# Patient Record
Sex: Female | Born: 1970 | Race: White | Hispanic: No | Marital: Single | Smoking: Never smoker
Health system: Southern US, Community
[De-identification: ages and names within clinical notes are randomized; demographics above are authoritative.]

## PROBLEM LIST (undated history)

## (undated) DIAGNOSIS — F329 Major depressive disorder, single episode, unspecified: Secondary | ICD-10-CM

## (undated) DIAGNOSIS — F419 Anxiety disorder, unspecified: Secondary | ICD-10-CM

## (undated) DIAGNOSIS — F32A Depression, unspecified: Secondary | ICD-10-CM

## (undated) DIAGNOSIS — N200 Calculus of kidney: Secondary | ICD-10-CM

## (undated) DIAGNOSIS — M1990 Unspecified osteoarthritis, unspecified site: Secondary | ICD-10-CM

---
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
## 2006-03-09 ENCOUNTER — Emergency Department: Payer: Self-pay | Admitting: Emergency Medicine

## 2006-03-10 ENCOUNTER — Emergency Department: Payer: Self-pay | Admitting: Emergency Medicine

## 2012-08-04 ENCOUNTER — Emergency Department: Payer: Self-pay | Admitting: Emergency Medicine

## 2012-08-04 LAB — CBC
HGB: 12.6 g/dL (ref 12.0–16.0)
MCH: 27.9 pg (ref 26.0–34.0)
MCHC: 32.1 g/dL (ref 32.0–36.0)
Platelet: 232 10*3/uL (ref 150–440)
RBC: 4.51 10*6/uL (ref 3.80–5.20)
WBC: 13.4 10*3/uL — ABNORMAL HIGH (ref 3.6–11.0)

## 2012-08-04 LAB — COMPREHENSIVE METABOLIC PANEL
Albumin: 3.7 g/dL (ref 3.4–5.0)
Alkaline Phosphatase: 65 U/L (ref 50–136)
BUN: 10 mg/dL (ref 7–18)
Bilirubin,Total: 0.4 mg/dL (ref 0.2–1.0)
Chloride: 107 mmol/L (ref 98–107)
Creatinine: 0.95 mg/dL (ref 0.60–1.30)
SGOT(AST): 25 U/L (ref 15–37)
SGPT (ALT): 21 U/L (ref 12–78)
Total Protein: 7.2 g/dL (ref 6.4–8.2)

## 2012-08-04 LAB — URINALYSIS, COMPLETE
Glucose,UR: NEGATIVE mg/dL (ref 0–75)
Nitrite: NEGATIVE
Ph: 5 (ref 4.5–8.0)
Specific Gravity: 1.031 (ref 1.003–1.030)
Squamous Epithelial: 9

## 2013-02-06 ENCOUNTER — Emergency Department: Payer: Self-pay | Admitting: Emergency Medicine

## 2013-02-06 LAB — CBC
HCT: 37 % (ref 35.0–47.0)
MCH: 29.6 pg (ref 26.0–34.0)
MCV: 88 fL (ref 80–100)
Platelet: 281 10*3/uL (ref 150–440)
RBC: 4.23 10*6/uL (ref 3.80–5.20)
RDW: 13.1 % (ref 11.5–14.5)

## 2013-02-06 LAB — BASIC METABOLIC PANEL
Co2: 27 mmol/L (ref 21–32)
Creatinine: 0.67 mg/dL (ref 0.60–1.30)
EGFR (Non-African Amer.): >60
Potassium: 3.1 mmol/L — ABNORMAL LOW (ref 3.5–5.1)
Sodium: 139 mmol/L (ref 136–145)

## 2015-06-24 ENCOUNTER — Emergency Department
Admission: EM | Admit: 2015-06-24 | Discharge: 2015-06-24 | Disposition: A | Discharge status: Home / Self Care | Payer: No Typology Code available for payment source | Attending: Emergency Medicine | Admitting: Emergency Medicine

## 2015-06-24 ENCOUNTER — Emergency Department: Payer: No Typology Code available for payment source

## 2015-06-24 DIAGNOSIS — Z79899 Other long term (current) drug therapy: Secondary | ICD-10-CM | POA: Insufficient documentation

## 2015-06-24 DIAGNOSIS — R109 Unspecified abdominal pain: Secondary | ICD-10-CM | POA: Diagnosis present

## 2015-06-24 DIAGNOSIS — N390 Urinary tract infection, site not specified: Secondary | ICD-10-CM | POA: Diagnosis not present

## 2015-06-24 DIAGNOSIS — Z87442 Personal history of urinary calculi: Secondary | ICD-10-CM | POA: Diagnosis not present

## 2015-06-24 HISTORY — DX: Calculus of kidney: N20.0

## 2015-06-24 LAB — URINALYSIS COMPLETE WITH MICROSCOPIC (ARMC ONLY)
BILIRUBIN URINE: NEGATIVE
Glucose, UA: NEGATIVE mg/dL
Ketones, ur: NEGATIVE mg/dL
Leukocytes, UA: NEGATIVE
Nitrite: NEGATIVE
PH: 6 (ref 5.0–8.0)
PROTEIN: NEGATIVE mg/dL
Specific Gravity, Urine: 1.006 (ref 1.005–1.030)

## 2015-06-24 LAB — CBC
HEMATOCRIT: 38.5 % (ref 35.0–47.0)
HEMOGLOBIN: 12.7 g/dL (ref 12.0–16.0)
MCH: 28.6 pg (ref 26.0–34.0)
MCHC: 33 g/dL (ref 32.0–36.0)
MCV: 86.8 fL (ref 80.0–100.0)
Platelets: 219 10*3/uL (ref 150–440)
RBC: 4.43 MIL/uL (ref 3.80–5.20)
RDW: 14.2 % (ref 11.5–14.5)
WBC: 6.7 10*3/uL (ref 3.6–11.0)

## 2015-06-24 LAB — BASIC METABOLIC PANEL
ANION GAP: 9 (ref 5–15)
BUN: 10 mg/dL (ref 6–20)
CHLORIDE: 105 mmol/L (ref 101–111)
CO2: 24 mmol/L (ref 22–32)
Calcium: 9.1 mg/dL (ref 8.9–10.3)
Creatinine, Ser: 0.7 mg/dL (ref 0.44–1.00)
GFR calc Af Amer: >60 mL/min (ref >60)
GFR calc non Af Amer: >60 mL/min (ref >60)
Glucose, Bld: 88 mg/dL (ref 65–99)
Potassium: 3.5 mmol/L (ref 3.5–5.1)
Sodium: 138 mmol/L (ref 135–145)

## 2015-06-24 MED ORDER — ONDANSETRON HCL 4 MG PO TABS: ORAL_TABLET | ORAL | Status: DC

## 2015-06-24 MED ORDER — CEPHALEXIN 500 MG PO CAPS: 500.0000 mg | ORAL_CAPSULE | Freq: Two times a day (BID) | ORAL | Status: DC

## 2015-06-24 MED ORDER — ONDANSETRON 8 MG PO TBDP
8.0000 mg | ORAL_TABLET | Freq: Once | ORAL | Status: AC
  Administered 2015-06-24: 8 mg via ORAL
  Filled 2015-06-24: qty 1

## 2015-06-24 MED ORDER — CEPHALEXIN 500 MG PO CAPS
500.0000 mg | ORAL_CAPSULE | Freq: Once | ORAL | Status: AC
  Administered 2015-06-24: 500 mg via ORAL
  Filled 2015-06-24: qty 1

## 2015-06-24 MED ORDER — HYDROCODONE-ACETAMINOPHEN 5-325 MG PO TABS: 1.0000 | ORAL_TABLET | ORAL | Status: DC | PRN

## 2015-06-24 MED ORDER — OXYCODONE-ACETAMINOPHEN 5-325 MG PO TABS
2.0000 | ORAL_TABLET | Freq: Once | ORAL | Status: AC
  Administered 2015-06-24: 2 via ORAL
  Filled 2015-06-24: qty 2

## 2015-06-24 NOTE — ED Notes (Signed)
AAOx3.  Skin warm and dry.  NAD 

## 2015-06-24 NOTE — ED Notes (Signed)
Pt c/o left flank pain since yesterday with nausea and blood in urine

## 2015-06-24 NOTE — ED Notes (Signed)
AAOx3.  Skin warm and dry.  NAD.  D/C home 

## 2015-06-24 NOTE — ED Provider Notes (Signed)
[facility] Emergency Department Provider Note  ____________________________________________  Time seen: Approximately 8:14 PM  I have reviewed the triage vital signs and the nursing notes.   HISTORY  Chief Complaint Flank Pain    HPI Brianna Randall is a 44 y.o. female with a history of kidney stones diagnosed about 4 years ago who presents with acute onset of sharp, stabbing left flank pain that started yesterday.  It is no waxing and waning but is getting worse.  She has also had multiple episodes of emesis associated with the pain.  She was last diagnosed with kidney stones about 4 years ago and they passed on their own.  She denies fever/chills, chest pain, shortness of breath, and other abdominal pain not from the flank.  Nothing makes it better and moving around seems to make it worse.   Past Medical History  Diagnosis Date  . Kidney stones     There are no active problems to display for this patient.   History reviewed. No pertinent past surgical history.  Current Outpatient Rx  Name  Route  Sig  Dispense  Refill  . ibuprofen (ADVIL,MOTRIN) 200 MG tablet   Oral   Take 800 mg by mouth every 6 (six) hours as needed for headache or mild pain.         . TURMERIC PO   Oral   Take 1 capsule by mouth daily.         . cephALEXin (KEFLEX) 500 MG capsule   Oral   Take 1 capsule (500 mg total) by mouth 2 (two) times daily.   14 capsule   0   . HYDROcodone-acetaminophen (NORCO/VICODIN) 5-325 MG per tablet   Oral   Take 1-2 tablets by mouth every 4 (four) hours as needed for moderate pain.   15 tablet   0   . ondansetron (ZOFRAN) 4 MG tablet      Take 1-2 tabs by mouth every 8 hours as needed for nausea/vomiting   30 tablet   0     Allergies Review of patient's allergies indicates no known allergies.  No family history on file.  Social History History  Substance Use Topics  . Smoking status: Never Smoker   . Smokeless tobacco:  Never Used  . Alcohol Use: No    Review of Systems Constitutional: No fever/chills Eyes: No visual changes. ENT: No sore throat. Cardiovascular: Denies chest pain. Respiratory: Denies shortness of breath. Gastrointestinal: No abdominal pain.  Multiple episodes of emesis.  No diarrhea.  No constipation. Genitourinary: Negative for dysuria. Musculoskeletal: Left flank pain. Skin: Negative for rash. Neurological: Negative for headaches, focal weakness or numbness.  10-point ROS otherwise negative.  ____________________________________________   PHYSICAL EXAM:  VITAL SIGNS: ED Triage Vitals  Enc Vitals Group     BP 06/24/15 1703 143/70 mmHg     Pulse Rate 06/24/15 1703 80     Resp 06/24/15 1703 16     Temp 06/24/15 1703 98.8 F (37.1 C)     Temp Source 06/24/15 1703 Oral     SpO2 06/24/15 1703 100 %     Weight 06/24/15 1703 220 lb (99.791 kg)     Height 06/24/15 1703  (1.651 m)     Head Cir --      Peak Flow --      Pain Score 06/24/15 1704 8     Pain Loc --      Pain Edu? --      Excl.  in GC? --     Constitutional: Alert and oriented. Well appearing and in no acute distress. Eyes: Conjunctivae are normal. PERRL. EOMI. Head: Atraumatic. Nose: No congestion/rhinnorhea. Mouth/Throat: Mucous membranes are moist.  Oropharynx non-erythematous. Neck: No stridor.   Cardiovascular: Normal rate, regular rhythm. Grossly normal heart sounds.  Good peripheral circulation. Respiratory: Normal respiratory effort.  No retractions. Lungs CTAB. Gastrointestinal: Soft and nontender. No distention. No abdominal bruits.  Mild left CVA tenderness. Musculoskeletal: No lower extremity tenderness nor edema.  No joint effusions. Neurologic:  Normal speech and language. No gross focal neurologic deficits are appreciated.  Skin:  Skin is warm, dry and intact. No rash noted.   ____________________________________________   LABS (all labs ordered are listed, but only abnormal results  are displayed)  Labs Reviewed  URINALYSIS COMPLETEWITH MICROSCOPIC ([facility] ONLY) - Abnormal; Notable for the following:    Color, Urine STRAW (*)    APPearance CLEAR (*)    Hgb urine dipstick 1+ (*)    Bacteria, UA RARE (*)    Squamous Epithelial / LPF 0-5 (*)    All other components within normal limits  URINE CULTURE  BASIC METABOLIC PANEL  CBC   ____________________________________________  EKG  Not indicated ____________________________________________  RADIOLOGY  US Renal  06/24/2015   CLINICAL DATA:  Acute left flank pain for 1 day  EXAM: RENAL / URINARY TRACT ULTRASOUND COMPLETE  COMPARISON:  None.  FINDINGS: Right Kidney:  Length: 11.9 cm. Echogenicity within normal limits. No mass or hydronephrosis visualized.  Left Kidney:  Length: 13.1 cm. Echogenicity within normal limits. No mass or hydronephrosis visualized.  Bladder:  Appears normal for degree of bladder distention. Bilateral ureteral jets are noted.  IMPRESSION: Normal renal ultrasound   Electronically Signed   By: Sherian Rein M.D.   On: 06/24/2015 21:26    ____________________________________________   PROCEDURES  Procedure(s) performed: None  Critical Care performed: No ____________________________________________   INITIAL IMPRESSION / ASSESSMENT AND PLAN / ED COURSE  Pertinent labs & imaging results that were available during my care of the patient were reviewed by me and considered in my medical decision making (see chart for details).  The patient is in no acute distress with normal vital signs.  She has a normal CBC and metabolic panel and her urinalysis is generally unremarkable.  Given her history of kidney stones and the description of her symptoms, I suspect she Scioneaux have already passed a stone and that is why there is not much in the way of hematuria.  She has no indication of systemic illness or acute, emergent intra-abdominal pathology.  I will obtain an ultrasound of her kidneys to look for  nephrolithiasis as well as evidence of hydronephrosis.  ----------------------------------------- 9:42 PM on 06/24/2015 -----------------------------------------  The patient has been in no acute distress for her nearly 5 and half hours in the emergency department.  Her pain is currently well controlled.  Her ultrasound was unremarkable.  I believe she Garczynski have hard he passed a kidney stone.  We discussed the results, and she stated that her urine is very foul-smelling and she decided that she does have some dysuria.  As a result I will treat with Keflex empirically.  Her urine culture is pending as well.  I gave my usual customary return precautions.  ____________________________________________  FINAL CLINICAL IMPRESSION(S) / ED DIAGNOSES  Final diagnoses:  Acute left flank pain  UTI (urinary tract infection), uncomplicated      NEW MEDICATIONS STARTED DURING THIS VISIT:  New  Prescriptions   CEPHALEXIN (KEFLEX) 500 MG CAPSULE    Take 1 capsule (500 mg total) by mouth 2 (two) times daily.   HYDROCODONE-ACETAMINOPHEN (NORCO/VICODIN) 5-325 MG PER TABLET    Take 1-2 tablets by mouth every 4 (four) hours as needed for moderate pain.   ONDANSETRON (ZOFRAN) 4 MG TABLET    Take 1-2 tabs by mouth every 8 hours as needed for nausea/vomiting     Loleta Roseory Trisha Morandi, MD 06/24/15 2142

## 2015-06-24 NOTE — ED Notes (Signed)
Left flank pain since yesterday  Pain is getting worse. Hx of renal stones in past

## 2015-06-24 NOTE — Discharge Instructions (Signed)
As we discussed, your workup was reassuring today, including a normal ultrasound.  Since you are having symptoms of UTI, we will treat you with antibiotics and a small number of pain pills and nausea medication.  We recommend you follow up with [facility] to set up a primary care doctor, and with Dr. Apolinar Junes if you need Urology follow up.  Return to the Emergency Department if you develop new or worsening symptoms that concern you.   Flank Pain Flank pain refers to pain that is located on the side of the body between the upper abdomen and the back. The pain Mier occur over a short period of time (acute) or Magee be long-term or reoccurring (chronic). It Hark be mild or severe. Flank pain can be caused by many things. CAUSES  Some of the more common causes of flank pain include:  Muscle strains.   Muscle spasms.   A disease of your spine (vertebral disk disease).   A lung infection (pneumonia).   Fluid around your lungs (pulmonary edema).   A kidney infection.   Kidney stones.   A very painful skin rash caused by the chickenpox virus (shingles).   Gallbladder disease.  HOME CARE INSTRUCTIONS  Home care will depend on the cause of your pain. In general,  Rest as directed by your caregiver.  Drink enough fluids to keep your urine clear or pale yellow.  Only take over-the-counter or prescription medicines as directed by your caregiver. Some medicines Leasure help relieve the pain.  Tell your caregiver about any changes in your pain.  Follow up with your caregiver as directed. SEEK IMMEDIATE MEDICAL CARE IF:   Your pain is not controlled with medicine.   You have new or worsening symptoms.  Your pain increases.   You have abdominal pain.   You have shortness of breath.   You have persistent nausea or vomiting.   You have swelling in your abdomen.   You feel faint or pass out.   You have blood in your urine.  You have a fever or persistent symptoms  for more than 2-3 days.  You have a fever and your symptoms suddenly get worse. MAKE SURE YOU:   Understand these instructions.  Will watch your condition.  Will get help right away if you are not doing well or get worse. Document Released: 01/14/2006 Document Revised: 08/17/2012 Document Reviewed: 07/07/2012 [facility] Patient Information 2015 [place], [place]. This information is not intended to replace advice given to you by your health care provider. Make sure you discuss any questions you have with your health care provider.  Urinary Tract Infection A urinary tract infection (UTI) can occur any place along the urinary tract. The tract includes the kidneys, ureters, bladder, and urethra. A type of germ called bacteria often causes a UTI. UTIs are often helped with antibiotic medicine.  HOME CARE   If given, take antibiotics as told by your doctor. Finish them even if you start to feel better.  Drink enough fluids to keep your pee (urine) clear or pale yellow.  Avoid tea, drinks with caffeine, and bubbly (carbonated) drinks.  Pee often. Avoid holding your pee in for a long time.  Pee before and after having sex (intercourse).  Wipe from front to back after you poop (bowel movement) if you are a woman. Use each tissue only once. GET HELP RIGHT AWAY IF:   You have back pain.  You have lower belly (abdominal) pain.  You have chills.  You  feel sick to your stomach (nauseous).  You throw up (vomit).  Your burning or discomfort with peeing does not go away.  You have a fever.  Your symptoms are not better in 3 days. MAKE SURE YOU:   Understand these instructions.  Will watch your condition.  Will get help right away if you are not doing well or get worse. Document Released: 05/11/2008 Document Revised: 08/17/2012 Document Reviewed: 06/23/2012 [facility] Patient Information 2015 [place]. This information is not intended to replace advice given to you by your health  care provider. Make sure you discuss any questions you have with your health care provider.

## 2015-06-26 LAB — URINE CULTURE
Culture: >=100000
SPECIAL REQUESTS: NORMAL

## 2015-09-10 ENCOUNTER — Emergency Department
Admission: EM | Admit: 2015-09-10 | Discharge: 2015-09-10 | Disposition: A | Discharge status: Home / Self Care | Payer: No Typology Code available for payment source | Attending: Student | Admitting: Student

## 2015-09-10 ENCOUNTER — Encounter: Payer: Self-pay | Admitting: Emergency Medicine

## 2015-09-10 ENCOUNTER — Emergency Department: Payer: No Typology Code available for payment source

## 2015-09-10 DIAGNOSIS — S0083XA Contusion of other part of head, initial encounter: Secondary | ICD-10-CM | POA: Insufficient documentation

## 2015-09-10 DIAGNOSIS — Y998 Other external cause status: Secondary | ICD-10-CM | POA: Insufficient documentation

## 2015-09-10 DIAGNOSIS — S022XXA Fracture of nasal bones, initial encounter for closed fracture: Secondary | ICD-10-CM | POA: Insufficient documentation

## 2015-09-10 DIAGNOSIS — S0990XA Unspecified injury of head, initial encounter: Secondary | ICD-10-CM | POA: Insufficient documentation

## 2015-09-10 DIAGNOSIS — Y9389 Activity, other specified: Secondary | ICD-10-CM | POA: Insufficient documentation

## 2015-09-10 DIAGNOSIS — Y9289 Other specified places as the place of occurrence of the external cause: Secondary | ICD-10-CM | POA: Insufficient documentation

## 2015-09-10 MED ORDER — HYDROMORPHONE HCL 1 MG/ML IJ SOLN
1.0000 mg | Freq: Once | INTRAMUSCULAR | Status: AC
  Administered 2015-09-10: 1 mg via INTRAMUSCULAR
  Filled 2015-09-10: qty 1

## 2015-09-10 MED ORDER — OXYCODONE-ACETAMINOPHEN 5-325 MG PO TABS: 1.0000 | ORAL_TABLET | ORAL | Status: DC | PRN

## 2015-09-10 NOTE — Discharge Instructions (Signed)
Nasal Fracture A nasal fracture is a break or crack in the bones or cartilage of the nose. Minor breaks do not require treatment. These breaks usually heal on their own after about one month. Serious breaks Sarris require surgery. CAUSES This injury is usually caused by a blunt injury to the nose. This type of injury often occurs from:  Contact sports.  Car accidents.  Falls.  Getting punched. SYMPTOMS Symptoms of this injury include:  Pain.  Swelling of the nose.  Bleeding from the nose.  Bruising around the nose or eyes. This Mcgaha include having black eyes.  Crooked appearance of the nose. DIAGNOSIS This injury Grabinski be diagnosed with a physical exam. The health care provider will gently feel the nose for signs of broken bones. He or she will look inside the nostrils to make sure that there is not a blood-filled swelling on the dividing wall between the nostrils (septal hematoma). X-rays of the nose Brandhorst not show a nasal fracture even when one is present. In some cases, X-rays or a CT scan Jehle be done 1-5 days after the injury. Sometimes, the health care provider will want to wait until the swelling has gone down. TREATMENT Often, minor fractures that have caused no deformity do not require treatment. More serious fractures in which bones have moved out of position Malloy require surgery, which will take place after the swelling is gone. Surgery will stabilize and align the fracture. In some cases, a health care provider Honea be able to reposition the bones without surgery. This Rittenhouse be done in the health care provider's office after medicine is given to numb the area (local anesthetic). HOME CARE INSTRUCTIONS  If directed, apply ice to the injured area:  Put ice in a plastic bag.  Place a towel between your skin and the bag.  Leave the ice on for 20 minutes, 2-3 times per day.  Take over-the-counter and prescription medicines only as told by your health care provider.  If your nose  starts to bleed, sit in an upright position while you squeeze the soft parts of your nose against the dividing wall between your nostrils (septum) for 10 minutes.  Try to avoid blowing your nose.  Return to your normal activities as told by your health care provider. Ask your health care provider what activities are safe for you.  Avoid contact sports for 3-4 weeks or as told by your health care provider.  Keep all follow-up visits as told by your health care provider. This is important. SEEK MEDICAL CARE IF:  Your pain increases or becomes severe.  You continue to have nosebleeds.  The shape of your nose does not return to normal within 5 days.  You have pus draining out of your nose. SEEK IMMEDIATE MEDICAL CARE IF:  You have bleeding from your nose that does not stop after you pinch your nostrils closed for 20 minutes and keep ice on your nose.  You have clear fluid draining out of your nose.  You notice a grape-like swelling on the septum. This swelling is a collection of blood (hematoma) that must be drained to help prevent infection.  You have difficulty moving your eyes.  You have repeated vomiting.   This information is not intended to replace advice given to you by your health care provider. Make sure you discuss any questions you have with your health care provider.   Document Released: 11/20/2000 Document Revised: 08/14/2015 Document Reviewed: 12/31/2014 Elsevier Interactive Patient Education 2016 Elsevier Inc.  
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
## 2016-05-07 ENCOUNTER — Encounter: Payer: Self-pay | Admitting: Emergency Medicine

## 2016-05-07 ENCOUNTER — Emergency Department: Payer: No Typology Code available for payment source

## 2016-05-07 ENCOUNTER — Emergency Department
Admission: EM | Admit: 2016-05-07 | Discharge: 2016-05-07 | Disposition: A | Discharge status: Home / Self Care | Payer: No Typology Code available for payment source | Attending: Emergency Medicine | Admitting: Emergency Medicine

## 2016-05-07 DIAGNOSIS — Y92810 Car as the place of occurrence of the external cause: Secondary | ICD-10-CM | POA: Insufficient documentation

## 2016-05-07 DIAGNOSIS — S60051A Contusion of right little finger without damage to nail, initial encounter: Secondary | ICD-10-CM | POA: Insufficient documentation

## 2016-05-07 DIAGNOSIS — Y9389 Activity, other specified: Secondary | ICD-10-CM | POA: Insufficient documentation

## 2016-05-07 DIAGNOSIS — W230XXA Caught, crushed, jammed, or pinched between moving objects, initial encounter: Secondary | ICD-10-CM | POA: Insufficient documentation

## 2016-05-07 DIAGNOSIS — Y999 Unspecified external cause status: Secondary | ICD-10-CM | POA: Insufficient documentation

## 2016-05-07 DIAGNOSIS — S6000XA Contusion of unspecified finger without damage to nail, initial encounter: Secondary | ICD-10-CM

## 2016-05-07 MED ORDER — HYDROCODONE-ACETAMINOPHEN 5-325 MG PO TABS: 1.0000 | ORAL_TABLET | ORAL | Status: DC | PRN
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
## 2016-05-07 NOTE — Discharge Instructions (Signed)
Contusion A contusion is a deep bruise. Contusions are the result of a blunt injury to tissues and muscle fibers under the skin. The injury causes bleeding under the skin. The skin overlying the contusion Badon turn blue, purple, or yellow. Minor injuries will give you a painless contusion, but more severe contusions Kobs stay painful and swollen for a few weeks.  CAUSES  This condition is usually caused by a blow, trauma, or direct force to an area of the body. SYMPTOMS  Symptoms of this condition include:  Swelling of the injured area.  Pain and tenderness in the injured area.  Discoloration. The area Behrmann have redness and then turn blue, purple, or yellow. DIAGNOSIS  This condition is diagnosed based on a physical exam and medical history. An X-ray, CT scan, or MRI Gorelick be needed to determine if there are any associated injuries, such as broken bones (fractures). TREATMENT  Specific treatment for this condition depends on what area of the body was injured. In general, the best treatment for a contusion is resting, icing, applying pressure to (compression), and elevating the injured area. This is often called the RICE strategy. Over-the-counter anti-inflammatory medicines Olvey also be recommended for pain control.  HOME CARE INSTRUCTIONS   Rest the injured area.  If directed, apply ice to the injured area:  Put ice in a plastic bag.  Place a towel between your skin and the bag.  Leave the ice on for 20 minutes, 2-3 times per day.  If directed, apply light compression to the injured area using an elastic bandage. Make sure the bandage is not wrapped too tightly. Remove and reapply the bandage as directed by your health care provider.  If possible, raise (elevate) the injured area above the level of your heart while you are sitting or lying down.  Take over-the-counter and prescription medicines only as told by your health care provider. SEEK MEDICAL CARE IF:  Your symptoms do not  improve after several days of treatment.  Your symptoms get worse.  You have difficulty moving the injured area. SEEK IMMEDIATE MEDICAL CARE IF:   You have severe pain.  You have numbness in a hand or foot.  Your hand or foot turns pale or cold.   This information is not intended to replace advice given to you by your health care provider. Make sure you discuss any questions you have with your health care provider.   Document Released: 09/02/2005 Document Revised: 08/14/2015 Document Reviewed: 04/10/2015 Elsevier Interactive Patient Education 2016 Elsevier Inc.  

## 2016-05-07 NOTE — ED Provider Notes (Signed)
[facility] Emergency Department Provider Note  ____________________________________________  Time seen: Approximately 8:54 PM  I have reviewed the triage vital signs and the nursing notes.   HISTORY  Chief Complaint Hand Injury    HPI Brianna Randall is a 45 y.o. female who presents to emergency department complaining of right hand fifth digit pain. Patient accidentally closed the car door on her right hand yesterday. Patient has had pain and swelling to the fifth digit right hand. Patient is tried Tylenol and Motrin at home with no relief. She has applied ice to the area. Patient denies any other injury or complaint. Pain is sharp, constant, worse on movement.   Past Medical History  Diagnosis Date  . Kidney stones     There are no active problems to display for this patient.   History reviewed. No pertinent past surgical history.  Current Outpatient Rx  Name  Route  Sig  Dispense  Refill  . HYDROcodone-acetaminophen (NORCO/VICODIN) 5-325 MG tablet   Oral   Take 1 tablet by mouth every 4 (four) hours as needed for moderate pain.   15 tablet   0   . oxyCODONE-acetaminophen (ROXICET) 5-325 MG tablet   Oral   Take 1-2 tablets by mouth every 4 (four) hours as needed for severe pain.   15 tablet   0     Allergies Review of patient's allergies indicates no known allergies.  No family history on file.  Social History Social History  Substance Use Topics  . Smoking status: Never Smoker   . Smokeless tobacco: Never Used  . Alcohol Use: No     Review of Systems  Constitutional: No fever/chills Cardiovascular: no chest pain. Respiratory: no cough. No SOB. Musculoskeletal: Positive for pain to the fifth digit right hand. Skin: Negative for rash, abrasions, lacerations, ecchymosis. Neurological: Negative for headaches, focal weakness or numbness. 10-point ROS otherwise negative.  ____________________________________________   PHYSICAL  EXAM:  VITAL SIGNS: ED Triage Vitals  Enc Vitals Group     BP 05/07/16 1931 129/87 mmHg     Pulse Rate 05/07/16 1931 107     Resp 05/07/16 1931 18     Temp 05/07/16 1931 98.3 F (36.8 C)     Temp Source 05/07/16 1931 Oral     SpO2 05/07/16 1931 98 %     Weight 05/07/16 1931 175 lb (79.379 kg)     Height 05/07/16 1931 5\' 5"  (1.651 m)     Head Cir --      Peak Flow --      Pain Score 05/07/16 1933 10     Pain Loc --      Pain Edu? --      Excl. in GC? --      Constitutional: Alert and oriented. Well appearing and in no acute distress. Eyes: Conjunctivae are normal. PERRL. EOMI. Head: Atraumatic. Cardiovascular: Normal rate, regular rhythm. Normal S1 and S2.  Good peripheral circulation. Respiratory: Normal respiratory effort without tachypnea or retractions. Lungs CTAB. Good air entry to the bases with no decreased or absent breath sounds. Musculoskeletal: No visible deformity to fifth digit right hand. Minor edema is noted to the proximal portion of the finger. Mild ecchymosis is noted from the PIP joint towards the MCP joint. Patient has full range of digit with coaxing. Sensation intact. Cap refill intact. No tenderness to palpation of the other osseous structures of the hand. Neurologic:  Normal speech and language. No gross focal neurologic deficits are appreciated.  Skin:  Skin is warm, dry and intact. No rash noted. Psychiatric: Mood and affect are normal. Speech and behavior are normal. Patient exhibits appropriate insight and judgement.   ____________________________________________   LABS (all labs ordered are listed, but only abnormal results are displayed)  Labs Reviewed - No data to display ____________________________________________  EKG   ____________________________________________  RADIOLOGY Festus Barren Cuthriell, personally viewed and evaluated these images (plain radiographs) as part of my medical decision making, as well as reviewing the written  report by the radiologist.  Dg Finger Little Right  05/07/2016  CLINICAL DATA:  Pain after trauma. EXAM: RIGHT LITTLE FINGER 2+V COMPARISON:  None. FINDINGS: There is no evidence of fracture or dislocation. There is no evidence of arthropathy or other focal bone abnormality. Soft tissues are unremarkable. IMPRESSION: Negative. Electronically Signed   By: Gerome Sam III M.D   On: 05/07/2016 19:52    ____________________________________________    PROCEDURES  Procedure(s) performed:       Medications - No data to display   ____________________________________________   INITIAL IMPRESSION / ASSESSMENT AND PLAN / ED COURSE  Pertinent labs & imaging results that were available during my care of the patient were reviewed by me and considered in my medical decision making (see chart for details).  Patient's diagnosis is consistent with fifth digit contusion to the right hand. X-rays reveal no acute osseous normality. Exam is reassuring with full range of motion to the finger with no concern for flexor or extensor tendon involvement. Fingers are buddy taped here in the emergency department for symptom relief.. Patient will be discharged home with prescriptions for limited pain medication for symptom control. Patient is to follow up with primary care provider as needed or otherwise directed. Patient is given ED precautions to return to the ED for any worsening or new symptoms.     ____________________________________________  FINAL CLINICAL IMPRESSION(S) / ED DIAGNOSES  Final diagnoses:  Finger contusion, initial encounter      NEW MEDICATIONS STARTED DURING THIS VISIT:  New Prescriptions   HYDROCODONE-ACETAMINOPHEN (NORCO/VICODIN) 5-325 MG TABLET    Take 1 tablet by mouth every 4 (four) hours as needed for moderate pain.        This chart was dictated using voice recognition software/Dragon. Despite best efforts to proofread, errors can occur which can change the  meaning. Any change was purely unintentional.    Racheal Patches, PA-C 05/07/16 2110  Rockne Menghini, MD 05/07/16 2341

## 2017-09-11 ENCOUNTER — Emergency Department (HOSPITAL_COMMUNITY)
Admission: EM | Admit: 2017-09-11 | Discharge: 2017-09-12 | Disposition: A | Discharge status: Home / Self Care | Payer: Self-pay | Attending: Emergency Medicine | Admitting: Emergency Medicine

## 2017-09-11 ENCOUNTER — Encounter (HOSPITAL_COMMUNITY): Payer: Self-pay | Admitting: Emergency Medicine

## 2017-09-11 DIAGNOSIS — L03115 Cellulitis of right lower limb: Secondary | ICD-10-CM | POA: Insufficient documentation

## 2017-09-11 LAB — CBC WITH DIFFERENTIAL/PLATELET
BASOS ABS: 0 10*3/uL (ref 0.0–0.1)
BASOS PCT: 0 %
EOS ABS: 0.1 10*3/uL (ref 0.0–0.7)
Eosinophils Relative: 1 %
HCT: 37.4 % (ref 36.0–46.0)
Hemoglobin: 12.2 g/dL (ref 12.0–15.0)
Lymphocytes Relative: 15 %
Lymphs Abs: 1.1 10*3/uL (ref 0.7–4.0)
MCH: 29 pg (ref 26.0–34.0)
MCHC: 32.6 g/dL (ref 30.0–36.0)
MCV: 89 fL (ref 78.0–100.0)
Monocytes Absolute: 0.6 10*3/uL (ref 0.1–1.0)
Monocytes Relative: 8 %
NEUTROS ABS: 5.5 10*3/uL (ref 1.7–7.7)
Neutrophils Relative %: 76 %
PLATELETS: 247 10*3/uL (ref 150–400)
RBC: 4.2 MIL/uL (ref 3.87–5.11)
RDW: 13.4 % (ref 11.5–15.5)
WBC: 7.3 10*3/uL (ref 4.0–10.5)

## 2017-09-11 LAB — I-STAT CG4 LACTIC ACID, ED: LACTIC ACID, VENOUS: 1.92 mmol/L — AB (ref 0.5–1.9)

## 2017-09-11 NOTE — ED Triage Notes (Signed)
Pt c/o wound to the right knee x 4 days. Knee is swollen, red, and warm to the touch. Afebrile.

## 2017-09-12 LAB — COMPREHENSIVE METABOLIC PANEL
ALBUMIN: 3.8 g/dL (ref 3.5–5.0)
ALT: 36 U/L (ref 14–54)
AST: 41 U/L (ref 15–41)
Alkaline Phosphatase: 144 U/L — ABNORMAL HIGH (ref 38–126)
Anion gap: 12 (ref 5–15)
BILIRUBIN TOTAL: 0.5 mg/dL (ref 0.3–1.2)
BUN: 7 mg/dL (ref 6–20)
CHLORIDE: 100 mmol/L — AB (ref 101–111)
CO2: 26 mmol/L (ref 22–32)
Calcium: 9 mg/dL (ref 8.9–10.3)
Creatinine, Ser: 0.59 mg/dL (ref 0.44–1.00)
GFR calc Af Amer: >60 mL/min (ref >60)
GFR calc non Af Amer: >60 mL/min (ref >60)
GLUCOSE: 94 mg/dL (ref 65–99)
Potassium: 3.4 mmol/L — ABNORMAL LOW (ref 3.5–5.1)
Sodium: 138 mmol/L (ref 135–145)
TOTAL PROTEIN: 7.4 g/dL (ref 6.5–8.1)

## 2017-09-12 LAB — URINALYSIS, ROUTINE W REFLEX MICROSCOPIC
Bilirubin Urine: NEGATIVE
GLUCOSE, UA: NEGATIVE mg/dL
Hgb urine dipstick: NEGATIVE
KETONES UR: NEGATIVE mg/dL
LEUKOCYTES UA: NEGATIVE
Nitrite: NEGATIVE
PH: 7 (ref 5.0–8.0)
Protein, ur: NEGATIVE mg/dL
SPECIFIC GRAVITY, URINE: 1.015 (ref 1.005–1.030)

## 2017-09-12 MED ORDER — DOXYCYCLINE HYCLATE 100 MG PO CAPS: 100.0000 mg | ORAL_CAPSULE | Freq: Two times a day (BID) | ORAL | 0 refills | Status: DC

## 2017-09-12 MED ORDER — DOXYCYCLINE HYCLATE 100 MG PO TABS
100.0000 mg | ORAL_TABLET | Freq: Once | ORAL | Status: AC
  Administered 2017-09-12: 100 mg via ORAL
  Filled 2017-09-12: qty 1

## 2017-09-12 MED ORDER — LIDOCAINE HCL (PF) 1 % IJ SOLN
10.0000 mL | Freq: Once | INTRAMUSCULAR | Status: AC
  Administered 2017-09-12: 10 mL
  Filled 2017-09-12: qty 10

## 2017-09-12 MED ORDER — OXYCODONE-ACETAMINOPHEN 5-325 MG PO TABS
1.0000 | ORAL_TABLET | Freq: Once | ORAL | Status: AC
  Administered 2017-09-12: 1 via ORAL
  Filled 2017-09-12: qty 1

## 2017-09-12 MED ORDER — OXYCODONE-ACETAMINOPHEN 5-325 MG PO TABS: 1.0000 | ORAL_TABLET | ORAL | 0 refills | Status: DC | PRN

## 2017-09-12 NOTE — ED Notes (Signed)
MD notified of patients request for pain medications

## 2017-09-12 NOTE — ED Provider Notes (Signed)
[facility]-EMERGENCY DEPT Provider Note   CSN: [id number] Arrival date & time: 09/11/17  2259     History   Chief Complaint Chief Complaint  Patient presents with  . Wound Infection    HPI Brianna Randall is a 46 y.o. female.  The history is provided by the patient.  She complains of pain and swelling in her right knee for the last 2 days. She rates pain at 10/10. Is worse with standing, better with lying still.She has history of psoriasis and thinks she [place] have picked that one of the scabs. It has been draining.  Past Medical History:  Diagnosis Date  . Kidney stones     There are no active problems to display for this patient.   History reviewed. No pertinent surgical history.  OB History    No data available       Home Medications    Prior to Admission medications   Medication Sig Start Date End Date Taking? Authorizing Provider  HYDROcodone-acetaminophen (NORCO/VICODIN) 5-325 MG tablet Take 1 tablet by mouth every 4 (four) hours as needed for moderate pain. 05/07/16   Cuthriell, Delorise Royals, PA-C  oxyCODONE-acetaminophen (ROXICET) 5-325 MG tablet Take 1-2 tablets by mouth every 4 (four) hours as needed for severe pain. 09/10/15   Evangeline Dakin, PA-C    Family History No family history on file.  Social History Social History  Substance Use Topics  . Smoking status: Never Smoker  . Smokeless tobacco: Never Used  . Alcohol use No     Allergies   Patient has no known allergies.   Review of Systems Review of Systems  All other systems reviewed and are negative.    Physical Exam Updated Vital Signs BP (!) 143/87 (BP Location: Right Arm)   Pulse 99   Temp 98.4 F (36.9 C) (Oral)   Resp 18   Ht  (1.651 m)   Wt 88 kg (194 lb)   SpO2 100%   BMI 32.28 kg/m   Physical Exam  Nursing note and vitals reviewed.  46 year old female, resting comfortably and in no acute distress. Vital signs are significant for borderline hypertension. Oxygen saturation is  100%, which is normal. Head is normocephalic and atraumatic. PERRLA, EOMI. Oropharynx is clear. Neck is nontender and supple without adenopathy or JVD. Back is nontender and there is no CVA tenderness. Lungs are clear without rales, wheezes, or rhonchi. Chest is nontender. Heart has regular rate and rhythm without murmur. Abdomen is soft, flat, nontender without masses or hepatosplenomegaly and peristalsis is normoactive. Extremities have 1+ edema. There is erythema and induration overlying the anteromedial aspect of the right knee of was some excoriation of the skin. No definite fluctuance, but is very tender. There is a surrounding area of erythema consistent with cellulitis. Skin is warm and dry without rash. Neurologic: Mental status is normal, cranial nerves are intact, there are no motor or sensory deficits.  ED Treatments / Results  Labs (all labs ordered are listed, but only abnormal results are displayed) Labs Reviewed  COMPREHENSIVE METABOLIC PANEL - Abnormal; Notable for the following:       Result Value   Potassium 3.4 (*)    Chloride 100 (*)    Alkaline Phosphatase 144 (*)    All other components within normal limits  I-STAT CG4 LACTIC ACID, ED - Abnormal; Notable for the following:    Lactic Acid, Venous 1.92 (*)    All other components within normal limits  CBC WITH  DIFFERENTIAL/PLATELET  URINALYSIS, ROUTINE W REFLEX MICROSCOPIC  I-STAT CG4 LACTIC ACID, ED    Procedures Procedures (including critical care time) EMERGENCY DEPARTMENT US SOFT TISSUE INTERPRETATION "Study: Limited Soft Tissue Ultrasound"  INDICATIONS: Soft tissue infection Multiple views of the body part were obtained in real-time with a multi-frequency linear probe  PERFORMED BY: Myself IMAGES ARCHIVED?: Yes SIDE:Right  BODY PART:Lower extremity INTERPRETATION:  Abcess present  INCISION AND DRAINAGE Performed by: ZOXWR,UEAVW Consent: Verbal consent obtained. Risks and benefits: risks,  benefits and alternatives were discussed Type: abscess  Body area: right knee  Anesthesia: local infiltration  Incision was made with a scalpel. 2 separate incisions were made.  Local anesthetic: lidocaine 1% without epinephrine  Anesthetic total: 4 ml  Complexity: complex Blunt dissection to break up loculations  Drainage: bloody, no definite pus identified  Patient tolerance: Patient tolerated the procedure well with no immediate complications.   Medications Ordered in ED Medications  doxycycline (VIBRA-TABS) tablet 100 mg (not administered)  lidocaine (PF) (XYLOCAINE) 1 % injection 10 mL (10 mLs Infiltration Given 09/12/17 0207)  oxyCODONE-acetaminophen (PERCOCET/ROXICET) 5-325 MG per tablet 1 tablet (1 tablet Oral Given 09/12/17 0206)     Initial Impression / Assessment and Plan / ED Course  I have reviewed the triage vital signs and the nursing notes.  Pertinent labs & imaging results that were available during my care of the patient were reviewed by me and considered in my medical decision making (see chart for details).  Abscess right knee with surrounding cellulitis. Limited bedside ultrasound done to confirm abscess. There are actually multiple small areas of pus collection. Incision and drainage is done, but no pus obtained. He is discharged with prescription for doxycycline and oxycodone Acetaminophen, referred to orthopedics for reevaluation. I suspect that she [place] need more extensive incision and drainage and possible debridement. Return precautions discussed..  Final Clinical Impressions(s) / ED Diagnoses   Final diagnoses:  Cellulitis of right knee    New Prescriptions New Prescriptions   DOXYCYCLINE (VIBRAMYCIN) 100 MG CAPSULE    Take 1 capsule (100 mg total) by mouth 2 (two) times daily.     Dione Booze, MD 09/12/17 [phone]

## 2017-09-12 NOTE — ED Notes (Signed)
ED Provider at bedside. 

## 2017-09-12 NOTE — Discharge Instructions (Signed)
Your ultrasound looked like there was an abscess, but no pus was obtained after opening it up. Please take the antibiotic, and follow up with the orthopedic doctor. Return if the redness is spreading, or if you develop a fever.

## 2017-09-12 NOTE — ED Notes (Signed)
Pt understood dc material. NAD noted. Scripts given at dc 

## 2017-10-05 ENCOUNTER — Other Ambulatory Visit: Payer: Self-pay | Admitting: *Deleted

## 2017-10-05 DIAGNOSIS — Z1231 Encounter for screening mammogram for malignant neoplasm of breast: Secondary | ICD-10-CM

## 2019-01-03 ENCOUNTER — Telehealth: Payer: Self-pay | Admitting: *Deleted

## 2019-01-03 NOTE — Telephone Encounter (Signed)
Telephoned patient, to leave a message, patient had no voice mail.

## 2019-01-06 ENCOUNTER — Telehealth: Payer: Self-pay | Admitting: *Deleted

## 2019-01-06 NOTE — Telephone Encounter (Signed)
Telephoned patient to leave a voice mail,patient did not have voice mail set up.

## 2019-05-09 ENCOUNTER — Inpatient Hospital Stay (HOSPITAL_COMMUNITY)
Admission: EM | Admit: 2019-05-09 | Discharge: 2019-05-16 | DRG: 571 | Disposition: A | Discharge status: Home / Self Care | Payer: Self-pay | Attending: Internal Medicine | Admitting: Internal Medicine

## 2019-05-09 ENCOUNTER — Emergency Department (HOSPITAL_COMMUNITY): Payer: Self-pay

## 2019-05-09 ENCOUNTER — Encounter (HOSPITAL_COMMUNITY): Payer: Self-pay | Admitting: Family Medicine

## 2019-05-09 ENCOUNTER — Other Ambulatory Visit: Payer: Self-pay

## 2019-05-09 DIAGNOSIS — L0291 Cutaneous abscess, unspecified: Secondary | ICD-10-CM | POA: Diagnosis present

## 2019-05-09 DIAGNOSIS — B9561 Methicillin susceptible Staphylococcus aureus infection as the cause of diseases classified elsewhere: Secondary | ICD-10-CM | POA: Diagnosis present

## 2019-05-09 DIAGNOSIS — Z79891 Long term (current) use of opiate analgesic: Secondary | ICD-10-CM

## 2019-05-09 DIAGNOSIS — Z6841 Body Mass Index (BMI) 40.0 and over, adult: Secondary | ICD-10-CM

## 2019-05-09 DIAGNOSIS — F1290 Cannabis use, unspecified, uncomplicated: Secondary | ICD-10-CM | POA: Diagnosis present

## 2019-05-09 DIAGNOSIS — Z590 Homelessness: Secondary | ICD-10-CM

## 2019-05-09 DIAGNOSIS — L02415 Cutaneous abscess of right lower limb: Secondary | ICD-10-CM | POA: Diagnosis present

## 2019-05-09 DIAGNOSIS — Z23 Encounter for immunization: Secondary | ICD-10-CM

## 2019-05-09 DIAGNOSIS — Z79899 Other long term (current) drug therapy: Secondary | ICD-10-CM

## 2019-05-09 DIAGNOSIS — L0211 Cutaneous abscess of neck: Secondary | ICD-10-CM | POA: Diagnosis present

## 2019-05-09 DIAGNOSIS — L03221 Cellulitis of neck: Secondary | ICD-10-CM | POA: Diagnosis present

## 2019-05-09 DIAGNOSIS — W57XXXA Bitten or stung by nonvenomous insect and other nonvenomous arthropods, initial encounter: Secondary | ICD-10-CM | POA: Diagnosis present

## 2019-05-09 DIAGNOSIS — I10 Essential (primary) hypertension: Secondary | ICD-10-CM | POA: Diagnosis present

## 2019-05-09 DIAGNOSIS — Z20828 Contact with and (suspected) exposure to other viral communicable diseases: Secondary | ICD-10-CM | POA: Diagnosis present

## 2019-05-09 DIAGNOSIS — L03115 Cellulitis of right lower limb: Principal | ICD-10-CM | POA: Diagnosis present

## 2019-05-09 DIAGNOSIS — E669 Obesity, unspecified: Secondary | ICD-10-CM | POA: Diagnosis present

## 2019-05-09 DIAGNOSIS — L409 Psoriasis, unspecified: Secondary | ICD-10-CM | POA: Diagnosis present

## 2019-05-09 DIAGNOSIS — Z87442 Personal history of urinary calculi: Secondary | ICD-10-CM

## 2019-05-09 DIAGNOSIS — Z87828 Personal history of other (healed) physical injury and trauma: Secondary | ICD-10-CM

## 2019-05-09 DIAGNOSIS — E876 Hypokalemia: Secondary | ICD-10-CM | POA: Diagnosis present

## 2019-05-09 DIAGNOSIS — D649 Anemia, unspecified: Secondary | ICD-10-CM | POA: Diagnosis present

## 2019-05-09 DIAGNOSIS — D638 Anemia in other chronic diseases classified elsewhere: Secondary | ICD-10-CM | POA: Diagnosis present

## 2019-05-09 DIAGNOSIS — G5783 Other specified mononeuropathies of bilateral lower limbs: Secondary | ICD-10-CM | POA: Diagnosis present

## 2019-05-09 DIAGNOSIS — G629 Polyneuropathy, unspecified: Secondary | ICD-10-CM

## 2019-05-09 LAB — CBC WITH DIFFERENTIAL/PLATELET
Abs Immature Granulocytes: 0.03 10*3/uL (ref 0.00–0.07)
Basophils Absolute: 0 10*3/uL (ref 0.0–0.1)
Basophils Relative: 0 %
Eosinophils Absolute: 0.2 10*3/uL (ref 0.0–0.5)
Eosinophils Relative: 2 %
HCT: 34.9 % — ABNORMAL LOW (ref 36.0–46.0)
Hemoglobin: 11 g/dL — ABNORMAL LOW (ref 12.0–15.0)
Immature Granulocytes: 0 %
Lymphocytes Relative: 11 %
Lymphs Abs: 0.8 10*3/uL (ref 0.7–4.0)
MCH: 29.5 pg (ref 26.0–34.0)
MCHC: 31.5 g/dL (ref 30.0–36.0)
MCV: 93.6 fL (ref 80.0–100.0)
Monocytes Absolute: 0.6 10*3/uL (ref 0.1–1.0)
Monocytes Relative: 8 %
Neutro Abs: 5.9 10*3/uL (ref 1.7–7.7)
Neutrophils Relative %: 79 %
Platelets: 332 10*3/uL (ref 150–400)
RBC: 3.73 MIL/uL — ABNORMAL LOW (ref 3.87–5.11)
RDW: 13.2 % (ref 11.5–15.5)
WBC: 7.6 10*3/uL (ref 4.0–10.5)
nRBC: 0 % (ref 0.0–0.2)

## 2019-05-09 LAB — BASIC METABOLIC PANEL
Anion gap: 6 (ref 5–15)
BUN: 10 mg/dL (ref 6–20)
CO2: 26 mmol/L (ref 22–32)
Calcium: 8.6 mg/dL — ABNORMAL LOW (ref 8.9–10.3)
Chloride: 108 mmol/L (ref 98–111)
Creatinine, Ser: 0.71 mg/dL (ref 0.44–1.00)
GFR calc Af Amer: >60 mL/min (ref >60)
GFR calc non Af Amer: >60 mL/min (ref >60)
Glucose, Bld: 92 mg/dL (ref 70–99)
Potassium: 3.1 mmol/L — ABNORMAL LOW (ref 3.5–5.1)
Sodium: 140 mmol/L (ref 135–145)

## 2019-05-09 LAB — LACTIC ACID, PLASMA: Lactic Acid, Venous: 0.6 mmol/L (ref 0.5–1.9)

## 2019-05-09 LAB — SARS CORONAVIRUS 2 BY RT PCR (HOSPITAL ORDER, PERFORMED IN ~~LOC~~ HOSPITAL LAB): SARS Coronavirus 2: NEGATIVE

## 2019-05-09 MED ORDER — ACETAMINOPHEN 650 MG RE SUPP: 650.0000 mg | Freq: Four times a day (QID) | RECTAL | Status: DC | PRN

## 2019-05-09 MED ORDER — ONDANSETRON HCL 4 MG/2ML IJ SOLN: 4.0000 mg | Freq: Four times a day (QID) | INTRAMUSCULAR | Status: DC | PRN

## 2019-05-09 MED ORDER — ALBUTEROL SULFATE (2.5 MG/3ML) 0.083% IN NEBU: 2.5000 mg | INHALATION_SOLUTION | Freq: Four times a day (QID) | RESPIRATORY_TRACT | Status: DC | PRN

## 2019-05-09 MED ORDER — POTASSIUM CHLORIDE CRYS ER 20 MEQ PO TBCR
40.0000 meq | EXTENDED_RELEASE_TABLET | Freq: Once | ORAL | Status: AC
  Administered 2019-05-09: 40 meq via ORAL
  Filled 2019-05-09: qty 2

## 2019-05-09 MED ORDER — BISACODYL 5 MG PO TBEC: 5.0000 mg | DELAYED_RELEASE_TABLET | Freq: Every day | ORAL | Status: DC | PRN

## 2019-05-09 MED ORDER — TRAMADOL HCL 50 MG PO TABS
50.0000 mg | ORAL_TABLET | Freq: Four times a day (QID) | ORAL | Status: DC | PRN
  Administered 2019-05-10 – 2019-05-16 (×6): 50 mg via ORAL
  Filled 2019-05-09 (×6): qty 1

## 2019-05-09 MED ORDER — MORPHINE SULFATE (PF) 2 MG/ML IV SOLN
1.0000 mg | INTRAVENOUS | Status: DC | PRN
  Administered 2019-05-09 – 2019-05-10 (×5): 1 mg via INTRAVENOUS
  Filled 2019-05-09 (×5): qty 1

## 2019-05-09 MED ORDER — PIPERACILLIN-TAZOBACTAM 3.375 G IVPB
3.3750 g | Freq: Three times a day (TID) | INTRAVENOUS | Status: DC
  Administered 2019-05-10 (×2): 3.375 g via INTRAVENOUS
  Filled 2019-05-09 (×4): qty 50

## 2019-05-09 MED ORDER — SENNOSIDES-DOCUSATE SODIUM 8.6-50 MG PO TABS: 1.0000 | ORAL_TABLET | Freq: Every evening | ORAL | Status: DC | PRN

## 2019-05-09 MED ORDER — ONDANSETRON HCL 4 MG/2ML IJ SOLN
4.0000 mg | Freq: Once | INTRAMUSCULAR | Status: AC
  Administered 2019-05-09: 4 mg via INTRAVENOUS
  Filled 2019-05-09: qty 2

## 2019-05-09 MED ORDER — LIDOCAINE-EPINEPHRINE (PF) 2 %-1:200000 IJ SOLN
10.0000 mL | Freq: Once | INTRAMUSCULAR | Status: AC
  Administered 2019-05-09: 10 mL
  Filled 2019-05-09: qty 10

## 2019-05-09 MED ORDER — ENOXAPARIN SODIUM 40 MG/0.4ML ~~LOC~~ SOLN
40.0000 mg | Freq: Every day | SUBCUTANEOUS | Status: DC
  Administered 2019-05-09 – 2019-05-13 (×5): 40 mg via SUBCUTANEOUS
  Filled 2019-05-09 (×6): qty 0.4

## 2019-05-09 MED ORDER — GABAPENTIN 400 MG PO CAPS
800.0000 mg | ORAL_CAPSULE | Freq: Three times a day (TID) | ORAL | Status: DC
  Administered 2019-05-09 – 2019-05-16 (×19): 800 mg via ORAL
  Filled 2019-05-09 (×20): qty 2

## 2019-05-09 MED ORDER — IPRATROPIUM BROMIDE 0.02 % IN SOLN: 0.5000 mg | Freq: Four times a day (QID) | RESPIRATORY_TRACT | Status: DC | PRN

## 2019-05-09 MED ORDER — MORPHINE SULFATE (PF) 4 MG/ML IV SOLN
4.0000 mg | Freq: Once | INTRAVENOUS | Status: AC
  Administered 2019-05-09: 4 mg via INTRAVENOUS
  Filled 2019-05-09: qty 1

## 2019-05-09 MED ORDER — ACETAMINOPHEN 325 MG PO TABS
650.0000 mg | ORAL_TABLET | Freq: Four times a day (QID) | ORAL | Status: DC | PRN
  Administered 2019-05-10: 650 mg via ORAL
  Filled 2019-05-09: qty 2

## 2019-05-09 MED ORDER — CITALOPRAM HYDROBROMIDE 20 MG PO TABS
20.0000 mg | ORAL_TABLET | Freq: Every day | ORAL | Status: DC
  Administered 2019-05-10 – 2019-05-16 (×6): 20 mg via ORAL
  Filled 2019-05-09 (×6): qty 1

## 2019-05-09 MED ORDER — MAGNESIUM CITRATE PO SOLN: 1.0000 | Freq: Once | ORAL | Status: DC | PRN

## 2019-05-09 MED ORDER — ONDANSETRON HCL 4 MG PO TABS: 4.0000 mg | ORAL_TABLET | Freq: Four times a day (QID) | ORAL | Status: DC | PRN

## 2019-05-09 MED ORDER — VANCOMYCIN HCL 10 G IV SOLR
2000.0000 mg | Freq: Once | INTRAVENOUS | Status: AC
  Administered 2019-05-09: 2000 mg via INTRAVENOUS
  Filled 2019-05-09: qty 2000

## 2019-05-09 MED ORDER — METOPROLOL TARTRATE 25 MG PO TABS
25.0000 mg | ORAL_TABLET | Freq: Two times a day (BID) | ORAL | Status: DC
  Administered 2019-05-09 – 2019-05-16 (×14): 25 mg via ORAL
  Filled 2019-05-09 (×14): qty 1

## 2019-05-09 MED ORDER — LEVOFLOXACIN IN D5W 750 MG/150ML IV SOLN
750.0000 mg | Freq: Once | INTRAVENOUS | Status: AC
  Administered 2019-05-09: 750 mg via INTRAVENOUS
  Filled 2019-05-09 (×2): qty 150

## 2019-05-09 MED ORDER — TETANUS-DIPHTH-ACELL PERTUSSIS 5-2.5-18.5 LF-MCG/0.5 IM SUSP
0.5000 mL | Freq: Once | INTRAMUSCULAR | Status: AC
  Administered 2019-05-09: 0.5 mL via INTRAMUSCULAR
  Filled 2019-05-09: qty 0.5

## 2019-05-09 MED ORDER — PIPERACILLIN-TAZOBACTAM 3.375 G IVPB 30 MIN
3.3750 g | Freq: Once | INTRAVENOUS | Status: AC
  Administered 2019-05-10: 3.375 g via INTRAVENOUS
  Filled 2019-05-09: qty 50

## 2019-05-09 MED ORDER — VANCOMYCIN HCL IN DEXTROSE 1-5 GM/200ML-% IV SOLN: 1000.0000 mg | Freq: Once | INTRAVENOUS | Status: DC

## 2019-05-09 NOTE — Progress Notes (Signed)
A consult was received from an ED physician for Vancomycin per pharmacy dosing.  The patient's profile has been reviewed for ht/wt/allergies/indication/available labs.   A one time order has been placed for Vancomycin 2000mg  IV x1.  Further antibiotics/pharmacy consults should be ordered by admitting physician if indicated.                       Thank you, Elson Clan 05/09/2019  7:36 PM

## 2019-05-09 NOTE — ED Provider Notes (Signed)
[facility]-EMERGENCY DEPT Provider Note   CSN: [id number] Arrival date & time: 05/09/19  1831    History   Chief Complaint Chief Complaint  Patient presents with  . Insect Bite    HPI Brianna Randall is a 48 y.o. female.     Patient with history of neuropathy, homelessness --presents the emergency department with worsening skin infections.  Patient states that she was in [place] and noted ant bites on her leg about 6 days ago.  She reports freshwater exposure and swimming in a river.  She developed redness and pain in her right lower leg as well as her right lateral neck that has rapidly increased over the past 24 to 48 hours.  She denies any documented fevers.  No nausea, vomiting, or diarrhea.  Pain is worse with palpation.  No significant drainage.  Denies IVDU, uses marijuana occasionally.  The onset of this condition was acute. The course is constant. Aggravating factors: none. Alleviating factors: none.       Past Medical History:  Diagnosis Date  . Kidney stones     There are no active problems to display for this patient.   History reviewed. No pertinent surgical history.   OB History   No obstetric history on file.      Home Medications    Prior to Admission medications   Medication Sig Start Date End Date Taking? Authorizing Provider  doxycycline (VIBRAMYCIN) 100 MG capsule Take 1 capsule (100 mg total) by mouth 2 (two) times daily. 09/12/17   Dione Booze, MD  gabapentin (NEURONTIN) 300 MG capsule Take 300 mg by mouth 3 (three) times daily.    [provider]  hydrOXYzine (VISTARIL) 25 MG capsule Take 25 mg by mouth 2 (two) times daily.    [provider]  ibuprofen (ADVIL,MOTRIN) 800 MG tablet Take 800 mg by mouth 3 (three) times daily.    [provider]  oxyCODONE-acetaminophen (ROXICET) 5-325 MG tablet Take 1-2 tablets by mouth every 4 (four) hours as needed for severe pain. 09/12/17   Dione Booze, MD     Family History History reviewed. No pertinent family history.  Social History Social History   Tobacco Use  . Smoking status: Never Smoker  . Smokeless tobacco: Never Used  Substance Use Topics  . Alcohol use: No  . Drug use: No     Allergies   Patient has no known allergies.   Review of Systems Review of Systems  Constitutional: Negative for fever.  HENT: Negative for rhinorrhea and sore throat.   Eyes: Negative for redness.  Respiratory: Negative for cough.   Cardiovascular: Negative for chest pain.  Gastrointestinal: Negative for abdominal pain, diarrhea, nausea and vomiting.  Genitourinary: Negative for dysuria.  Musculoskeletal: Positive for myalgias.  Skin: Positive for color change. Negative for rash.  Neurological: Negative for headaches.     Physical Exam Updated Vital Signs BP (!) 158/100 (BP Location: Left Arm)   Pulse (!) 103   Temp 98.7 F (37.1 C) (Oral)   Resp 18   Ht 5\' 2"  (1.575 m)   Wt 106.6 kg   SpO2 100%   BMI 42.98 kg/m   Physical Exam Vitals signs and nursing note reviewed.  Constitutional:      Appearance: She is well-developed.  HENT:     Head: Normocephalic and atraumatic.  Eyes:     General:        Right eye: No discharge.        Left  eye: No discharge.     Conjunctiva/sclera: Conjunctivae normal.  Neck:     Musculoskeletal: Normal range of motion and neck supple.  Cardiovascular:     Rate and Rhythm: Normal rate and regular rhythm.     Heart sounds: Normal heart sounds.  Pulmonary:     Effort: Pulmonary effort is normal.     Breath sounds: Normal breath sounds.  Abdominal:     Palpations: Abdomen is soft.     Tenderness: There is no abdominal tenderness.  Skin:    General: Skin is warm and dry.     Comments: R anterior lower leg: There is a 20cm x 20cm area of cellulitis.  No active drainage.  Central irregular area of necrotic tissue overlying an abscess pocket.  Right lateral neck: There is a 7 cm x 5 cm area of  erythema and induration consistent with abscess.  There is a mild amount of active drainage that is purulent and yellow-green in color.  Neurological:     Mental Status: She is alert.           ED Treatments / Results  Labs (all labs ordered are listed, but only abnormal results are displayed) Labs Reviewed  CBC WITH DIFFERENTIAL/PLATELET - Abnormal; Notable for the following components:      Result Value   RBC 3.73 (*)    Hemoglobin 11.0 (*)    HCT 34.9 (*)    All other components within normal limits  BASIC METABOLIC PANEL - Abnormal; Notable for the following components:   Potassium 3.1 (*)    Calcium 8.6 (*)    All other components within normal limits  CULTURE, BLOOD (ROUTINE X 2)  CULTURE, BLOOD (ROUTINE X 2)  SARS CORONAVIRUS 2 (HOSPITAL ORDER, PERFORMED IN [facility])  AEROBIC CULTURE (SUPERFICIAL SPECIMEN)  AEROBIC/ANAEROBIC CULTURE (SURGICAL/DEEP WOUND)  LACTIC ACID, PLASMA    EKG None  Radiology Dg Tibia/fibula Right  Result Date: 05/09/2019 CLINICAL DATA:  Pain status post bite. EXAM: RIGHT TIBIA AND FIBULA - 2 VIEW COMPARISON:  None. FINDINGS: There is nonspecific soft tissue swelling about the right lower extremity. There is no displaced fracture. No dislocation. No radiopaque foreign body. Skin thickening is noted in the proximal pretibial region. IMPRESSION: 1. No acute osseous abnormality. 2. Nonspecific soft tissue swelling about the lower extremity. Clinical correlation is recommended. There is some skin thickening involving the pretibial region at the level of the proximal tibia. Findings can be seen with cellulitis. Electronically Signed   By: Katherine Mantlehristopher  Green M.D.   On: 05/09/2019 19:54    Procedures .Marland Kitchen.Incision and Drainage Date/Time: 05/09/2019 8:18 PM Performed by: Renne CriglerGeiple, Halil Rentz, PA-C Authorized by: Renne CriglerGeiple, Shoji Pertuit, PA-C   Consent:    Consent obtained:  Verbal   Consent given by:  Patient   Risks discussed:  Bleeding, pain and  incomplete drainage   Alternatives discussed:  No treatment Location:    Type:  Abscess   Size:  6cm   Location:  Lower extremity   Lower extremity location:  Leg   Leg location:  R lower leg Pre-procedure details:    Skin preparation:  Betadine Anesthesia (see MAR for exact dosages):    Anesthesia method:  Local infiltration   Local anesthetic:  Lidocaine 2% WITH epi Procedure type:    Complexity:  Complex Procedure details:    Incision types:  Stab incision   Scalpel blade:  11   Wound management:  Probed and deloculated   Drainage:  Purulent and bloody  Drainage amount:  Copious   Packing materials:  1/4 in iodoform gauze Post-procedure details:    Patient tolerance of procedure:  Tolerated well, no immediate complications   (including critical care time)  Medications Ordered in ED Medications  Tdap (BOOSTRIX) injection 0.5 mL (has no administration in time range)  levofloxacin (LEVAQUIN) IVPB 750 mg (has no administration in time range)  vancomycin (VANCOCIN) 2,000 mg in sodium chloride 0.9 % 500 mL IVPB (2,000 mg Intravenous New Bag/Given 05/09/19 1958)  potassium chloride SA (K-DUR) CR tablet 40 mEq (has no administration in time range)  lidocaine-EPINEPHrine (XYLOCAINE W/EPI) 2 %-1:200000 (PF) injection 10 mL (10 mLs Infiltration Given by Other 05/09/19 2017)     Initial Impression / Assessment and Plan / ED Course  I have reviewed the triage vital signs and the nursing notes.  Pertinent labs & imaging results that were available during my care of the patient were reviewed by me and considered in my medical decision making (see chart for details).        Patient seen and examined. Work-up initiated. Medications ordered.   Vital signs reviewed and are as follows: BP (!) 158/100 (BP Location: Left Arm)   Pulse (!) 103   Temp 98.7 F (37.1 C) (Oral)   Resp 18   Ht  (1.575 m)   Wt 106.6 kg   SpO2 100%   BMI 42.98 kg/m   EMERGENCY DEPARTMENT [place] SOFT  TISSUE INTERPRETATION "Study: Limited Soft Tissue Ultrasound"  INDICATIONS: Soft tissue infection Multiple views of the body part were obtained in real-time with a multi-frequency linear probe  PERFORMED BY: Myself IMAGES ARCHIVED?: Yes SIDE:Right  BODY PART:Lower extremity INTERPRETATION:  Abcess present and Cellulitis present  8:27 PM I&D performed. Wound cultures ordered. Will request admission for IV antibiotics given extent of infection, possible contamination, complex abscess, multiple locations of infection, homelessness.   8:40 PM Spoke with hospitalist who will see.    Final Clinical Impressions(s) / ED Diagnoses   Final diagnoses:  Cellulitis and abscess of right lower extremity  Cellulitis and abscess of neck  Hypokalemia   Admit.   ED Discharge Orders    None       Renne Crigler, Cordelia Poche 05/09/19 2041    Gerhard Munch, MD 05/10/19 [phone]

## 2019-05-09 NOTE — ED Triage Notes (Signed)
Patient reports she was camping near [place], [place] last week. Noticed ant bites on Thursday or Friday to her right lower leg and right neck.

## 2019-05-09 NOTE — ED Notes (Signed)
Patient requesting pain medication again. PA made aware

## 2019-05-09 NOTE — ED Notes (Signed)
Unable to obtain second set of blood cultures.  

## 2019-05-09 NOTE — H&P (Signed)
History and Physical   [facility] -  @ [facility] Admission History and Physical [employer], D.O.    Patient Name: Brianna Randall MR#: [id number] Date of Birth: [birth year] Date of Admission: 05/09/2019  Referring MD/NP/PA: PA Renne Crigler Primary Care Physician: Hilbert Corrigan Chales Abrahams, NP  Chief Complaint:  Chief Complaint  Patient presents with  . Insect Bite    HPI: Brianna Randall is a 48 y.o. female with a known history of peripheral neuropathy, kidney stones, homelessness presents to the emergency department for evaluation of an insect bite.  Patient was in a usual state of health until 6 days ago she noticed that she had several insect bites after swimming in a river.  The bites were on her right lower leg and right lateral neck.  She developed redness and swelling within the last 24 hours, not associated with any fevers, chills..  Patient denies fevers/chills, weakness, dizziness, chest pain, shortness of breath, N/V/C/D, abdominal pain, dysuria/frequency, changes in mental status.    Otherwise there has been no change in status. Patient has been taking medication as prescribed and there has been no recent change in medication or diet.  No recent antibiotics.  There has been no recent illness, hospitalizations, travel or sick contacts.    EMS/ED Course: Patient received Levaquin. Medical admission has been requested for further management of right lower extremity cellulitis with abscess, right neck cellulitis.  Review of Systems:  CONSTITUTIONAL: No fever/chills, fatigue, weakness, weight gain/loss, headache. EYES: No blurry or double vision. ENT: No tinnitus, postnasal drip, redness or soreness of the oropharynx. RESPIRATORY: No cough, dyspnea, wheeze.  No hemoptysis.  CARDIOVASCULAR: No chest pain, palpitations, syncope, orthopnea. No lower extremity edema.  GASTROINTESTINAL: No nausea, vomiting, abdominal pain, diarrhea, constipation.  No hematemesis, melena or  hematochezia. GENITOURINARY: No dysuria, frequency, hematuria. ENDOCRINE: No polyuria or nocturia. No heat or cold intolerance. HEMATOLOGY: No anemia, bruising, bleeding. INTEGUMENTARY: Positive right lower extremity and right neck rash MUSCULOSKELETAL: No arthritis, gout. NEUROLOGIC: No numbness, tingling, ataxia, seizure-type activity, weakness. PSYCHIATRIC: No anxiety, depression, insomnia.   Past Medical History:  Diagnosis Date  . Kidney stones     History reviewed. No pertinent surgical history.   reports that she has never smoked. She has never used smokeless tobacco. She reports that she does not drink alcohol or use drugs.  No Known Allergies  History reviewed. No pertinent family history.  Prior to Admission medications   Medication Sig Start Date End Date Taking? Authorizing Provider  citalopram (CELEXA) 20 MG tablet Take 20 mg by mouth daily.   Yes [provider]  gabapentin (NEURONTIN) 800 MG tablet Take 800 mg by mouth 3 (three) times daily.   Yes [provider]  metoprolol tartrate (LOPRESSOR) 25 MG tablet Take 25 mg by mouth 2 (two) times daily.   Yes [provider]  doxycycline (VIBRAMYCIN) 100 MG capsule Take 1 capsule (100 mg total) by mouth 2 (two) times daily. Patient not taking: Reported on 05/09/2019 09/12/17   Dione Booze, MD  oxyCODONE-acetaminophen (ROXICET) 5-325 MG tablet Take 1-2 tablets by mouth every 4 (four) hours as needed for severe pain. Patient not taking: Reported on 05/09/2019 09/12/17   Dione Booze, MD    Physical Exam: Vitals:   05/09/19 1845  BP: (!) 158/100  Pulse: (!) 103  Resp: 18  Temp: 98.7 F (37.1 C)  TempSrc: Oral  SpO2: 100%  Weight: 106.6 kg  Height:  (1.575 m)    GENERAL: 48 y.o.-year-old  female patient, well-developed, well-nourished lying in the bed in no acute distress.  Pleasant and cooperative.   HEENT: Head atraumatic, normocephalic. Pupils equal. Mucus membranes moist. NECK:  Supple. No JVD. CHEST: Normal breath sounds bilaterally. No wheezing, rales, rhonchi or crackles. No use of accessory muscles of respiration.  No reproducible chest wall tenderness.  CARDIOVASCULAR: S1, S2 normal. No murmurs, rubs, or gallops. Cap refill <2 seconds. Pulses intact distally.  ABDOMEN: Soft, nondistended, nontender. No rebound, guarding, rigidity. Normoactive bowel sounds present in all four quadrants.  EXTREMITIES: No pedal edema, cyanosis, or clubbing. No calf tenderness or Homan's sign.  NEUROLOGIC: The patient is alert and oriented x 3. Cranial nerves II through XII are grossly intact with no focal sensorimotor deficit. PSYCHIATRIC:  Normal affect, mood, thought content. SKIN: Right anterior and lateral leg there is a large circular area of erythema and warmth with 2 excoriated lesions with central necrosis, areas of purulent pustules.  At the right lateral neck there is a 7 x 5 cm area of an erythematous base with purulent discharge and a central necrotic area.    Labs on Admission:  CBC: Recent Labs  Lab 05/09/19 1926  WBC 7.6  NEUTROABS 5.9  HGB 11.0*  HCT 34.9*  MCV 93.6  PLT 332   Basic Metabolic Panel: Recent Labs  Lab 05/09/19 1926  NA 140  K 3.1*  CL 108  CO2 26  GLUCOSE 92  BUN 10  CREATININE 0.71  CALCIUM 8.6*   GFR: Estimated Creatinine Clearance: 97.6 mL/min (by C-G formula based on SCr of 0.71 mg/dL). Liver Function Tests: No results for input(s): AST, ALT, ALKPHOS, BILITOT, PROT, ALBUMIN in the last 168 hours. No results for input(s): LIPASE, AMYLASE in the last 168 hours. No results for input(s): AMMONIA in the last 168 hours. Coagulation Profile: No results for input(s): INR, PROTIME in the last 168 hours. Cardiac Enzymes: No results for input(s): CKTOTAL, CKMB, CKMBINDEX, TROPONINI in the last 168 hours. BNP (last 3 results) No results for input(s): PROBNP in the last 8760 hours. HbA1C: No results for input(s): HGBA1C in the last 72  hours. CBG: No results for input(s): GLUCAP in the last 168 hours. Lipid Profile: No results for input(s): CHOL, HDL, LDLCALC, TRIG, CHOLHDL, LDLDIRECT in the last 72 hours. Thyroid Function Tests: No results for input(s): TSH, T4TOTAL, FREET4, T3FREE, THYROIDAB in the last 72 hours. Anemia Panel: No results for input(s): VITAMINB12, FOLATE, FERRITIN, TIBC, IRON, RETICCTPCT in the last 72 hours. Urine analysis:    Component Value Date/Time   COLORURINE YELLOW 09/11/2017 2318   APPEARANCEUR CLEAR 09/11/2017 2318   APPEARANCEUR Cloudy 08/04/2012 0118   LABSPEC 1.015 09/11/2017 2318   LABSPEC 1.031 08/04/2012 0118   PHURINE 7.0 09/11/2017 2318   GLUCOSEU NEGATIVE 09/11/2017 2318   GLUCOSEU Negative 08/04/2012 0118   HGBUR NEGATIVE 09/11/2017 2318   BILIRUBINUR NEGATIVE 09/11/2017 2318   BILIRUBINUR Negative 08/04/2012 0118   KETONESUR NEGATIVE 09/11/2017 2318   PROTEINUR NEGATIVE 09/11/2017 2318   NITRITE NEGATIVE 09/11/2017 2318   LEUKOCYTESUR NEGATIVE 09/11/2017 2318   LEUKOCYTESUR 1+ 08/04/2012 0118   Sepsis Labs: @LABRCNTIP (procalcitonin:4,lacticidven:4) )No results found for this or any previous visit (from the past 240 hour(s)).   Radiological Exams on Admission: Dg Tibia/fibula Right  Result Date: 05/09/2019 CLINICAL DATA:  Pain status post bite. EXAM: RIGHT TIBIA AND FIBULA - 2 VIEW COMPARISON:  None. FINDINGS: There is nonspecific soft tissue swelling about the right lower extremity. There is no displaced fracture. No dislocation. No  radiopaque foreign body. Skin thickening is noted in the proximal pretibial region. IMPRESSION: 1. No acute osseous abnormality. 2. Nonspecific soft tissue swelling about the lower extremity. Clinical correlation is recommended. There is some skin thickening involving the pretibial region at the level of the proximal tibia. Findings can be seen with cellulitis. Electronically Signed   By: Katherine Mantlehristopher  Green M.D.   On: 05/09/2019 19:54      Assessment/Plan  This is a 48 y.o. female with a history of neuropathy, kidney stones now being admitted with:  #.  Purulent cellulitis of the right lateral neck and right lower leg -Admit inpatient - Expand antibiotic spectrum to Zosyn and Vanco -Pain control -Follow-up cultures Please consult general surgery for consideration of I&D  #.  Mild hypokalemia -Replace orally  #.  History of neuropathy -Continue gabapentin, Celexa  #.  History of hypertension -Continue metoprolol  Social work consult regarding medications, safe living environment, disability.   Admission status: Inpatient IV Fluids: Hep-Lock Diet/Nutrition: Heart healthy Consults called: Surgery to be called in a.m. DVT Px: Lovenox, SCDs and early ambulation. Code Status: Full Code  Disposition Plan: To home in 2-3 days  All the records are reviewed and case discussed with ED provider. Management plans discussed with the patient and/or family who express understanding and agree with plan of care.  Rual Vermeer D.O. on 05/09/2019 at 8:39 PM CC: Primary care physician; [name], [name], NP   05/09/2019, 8:39 PM

## 2019-05-10 ENCOUNTER — Inpatient Hospital Stay (HOSPITAL_COMMUNITY): Payer: Self-pay

## 2019-05-10 DIAGNOSIS — I10 Essential (primary) hypertension: Secondary | ICD-10-CM | POA: Diagnosis present

## 2019-05-10 DIAGNOSIS — L03221 Cellulitis of neck: Secondary | ICD-10-CM

## 2019-05-10 DIAGNOSIS — L0211 Cutaneous abscess of neck: Secondary | ICD-10-CM

## 2019-05-10 DIAGNOSIS — G629 Polyneuropathy, unspecified: Secondary | ICD-10-CM

## 2019-05-10 DIAGNOSIS — B9561 Methicillin susceptible Staphylococcus aureus infection as the cause of diseases classified elsewhere: Secondary | ICD-10-CM

## 2019-05-10 DIAGNOSIS — I159 Secondary hypertension, unspecified: Secondary | ICD-10-CM

## 2019-05-10 DIAGNOSIS — L409 Psoriasis, unspecified: Secondary | ICD-10-CM

## 2019-05-10 DIAGNOSIS — D649 Anemia, unspecified: Secondary | ICD-10-CM | POA: Diagnosis present

## 2019-05-10 DIAGNOSIS — L02415 Cutaneous abscess of right lower limb: Secondary | ICD-10-CM

## 2019-05-10 DIAGNOSIS — E669 Obesity, unspecified: Secondary | ICD-10-CM | POA: Diagnosis present

## 2019-05-10 DIAGNOSIS — Z6841 Body Mass Index (BMI) 40.0 and over, adult: Secondary | ICD-10-CM

## 2019-05-10 DIAGNOSIS — Z872 Personal history of diseases of the skin and subcutaneous tissue: Secondary | ICD-10-CM

## 2019-05-10 DIAGNOSIS — Z87442 Personal history of urinary calculi: Secondary | ICD-10-CM | POA: Diagnosis present

## 2019-05-10 LAB — CBC
HCT: 33.5 % — ABNORMAL LOW (ref 36.0–46.0)
Hemoglobin: 10.9 g/dL — ABNORMAL LOW (ref 12.0–15.0)
MCH: 30.9 pg (ref 26.0–34.0)
MCHC: 32.5 g/dL (ref 30.0–36.0)
MCV: 94.9 fL (ref 80.0–100.0)
Platelets: 300 10*3/uL (ref 150–400)
RBC: 3.53 MIL/uL — ABNORMAL LOW (ref 3.87–5.11)
RDW: 13.3 % (ref 11.5–15.5)
WBC: 8.3 10*3/uL (ref 4.0–10.5)
nRBC: 0 % (ref 0.0–0.2)

## 2019-05-10 LAB — PHOSPHORUS: Phosphorus: 3.6 mg/dL (ref 2.5–4.6)

## 2019-05-10 LAB — MAGNESIUM: Magnesium: 1.8 mg/dL (ref 1.7–2.4)

## 2019-05-10 LAB — BASIC METABOLIC PANEL
Anion gap: 6 (ref 5–15)
BUN: 7 mg/dL (ref 6–20)
CO2: 25 mmol/L (ref 22–32)
Calcium: 8.2 mg/dL — ABNORMAL LOW (ref 8.9–10.3)
Chloride: 107 mmol/L (ref 98–111)
Creatinine, Ser: 0.61 mg/dL (ref 0.44–1.00)
GFR calc Af Amer: >60 mL/min (ref >60)
GFR calc non Af Amer: >60 mL/min (ref >60)
Glucose, Bld: 95 mg/dL (ref 70–99)
Potassium: 3.6 mmol/L (ref 3.5–5.1)
Sodium: 138 mmol/L (ref 135–145)

## 2019-05-10 LAB — C-REACTIVE PROTEIN: CRP: 3.2 mg/dL — ABNORMAL HIGH (ref <1.0)

## 2019-05-10 LAB — HIV ANTIBODY (ROUTINE TESTING W REFLEX): HIV Screen 4th Generation wRfx: NONREACTIVE

## 2019-05-10 LAB — SEDIMENTATION RATE: Sed Rate: 53 mm/hr — ABNORMAL HIGH (ref 0–22)

## 2019-05-10 MED ORDER — IOHEXOL 300 MG/ML  SOLN
75.0000 mL | Freq: Once | INTRAMUSCULAR | Status: AC | PRN
  Administered 2019-05-10: 22:00:00 75 mL via INTRAVENOUS

## 2019-05-10 MED ORDER — VANCOMYCIN HCL 10 G IV SOLR
1500.0000 mg | INTRAVENOUS | Status: DC
  Administered 2019-05-10: 1500 mg via INTRAVENOUS
  Filled 2019-05-10 (×3): qty 1500

## 2019-05-10 NOTE — Progress Notes (Signed)
PROGRESS NOTE    Brianna Randall  OVA:[id number] DOB: 1971-09-09 DOA: 05/09/2019 PCP: Lavinia Sharps, NP  Brief Narrative:  Brianna Randall is a 48 y.o. female with a known history of peripheral neuropathy, kidney stones, homelessness presents to the emergency department for evaluation of an insect bite.  Patient was in a usual state of health until 6 days ago she noticed that she had several insect bites after swimming in a river.  The bites were on her right lower leg and right lateral neck.  She developed redness and swelling within the last 24 hours, not associated with any fevers, chills..  Patient denies fevers/chills, weakness, dizziness, chest pain, shortness of breath, N/V/C/D, abdominal pain, dysuria/frequency, changes in mental status.    Otherwise there has been no change in status. Patient has been taking medication as prescribed and there has been no recent change in medication or diet.  No recent antibiotics.  There has been no recent illness, hospitalizations, travel or sick contacts.    EMS/ED Course: Patient received Levaquin. Medical admission has been requested for further management of right lower extremity cellulitis with abscess, right neck cellulitis.   **Interim History General surgery and infectious diseases were consulted for further evaluation recommendations.  General surgery is obtaining a CT of the soft tissue of the neck.  General surgery also recommended orthopedic evaluation for her right lower leg and I spoke with Dr. Roda Shutters who will see the patient in consultation but requested patient be transferred to [facility] for incision and drainage in the OR tomorrow  Assessment & Plan:   Active Problems:   Cellulitis and abscess of right lower extremity  Purulent cellulitis of the right lateral neck and right lower leg -Admitted Inpatient given severity and need for IV Abx -Expanded Antibiotic spectrum to IV Zosyn and Vanco; Infectious Diseases consulted for further evaluation  and recommendations they have discontinued IV Zosyn -Pain control with po Tramadol 50 mg q6hprn Moderate Pain and IV Morphine 1 mg q4hprn Severe Pain  -Follow-up cultures -Will Consult general surgery for consideration of I&D and they are evaluating the patient's neck and have recommended obtaining a CT of the neck which is still pending to be done -General surgery also recommended orthopedic evaluation for her leg and I spoke with Dr. Roda Shutters requests the patient be transferred to [facility] for incision and drainage in the OR tomorrow  Mild Hypokalemia -K+ on Admission was 3.1 and improved to 3.6 after repletion  -Replete with po KCl 40 mEQ yesterday -Mag Level is  -Continue to Monitor and Replete as Necessary -Repeat CBC in AM   History of Neuropathy -Continue Gabapentin 800 mg po TID and with Citalopram 20 mg po Daily   History of Hypertension -Continue Metoprolol 25 mg po BID  Normocytic Anemia  -Patient's Hb/Hct went from 11.0/34.9 -> 10.9/33.5 -Check Anemia Panel in the AM -Continue to Monitor for S/Sx of Bleeding  -Repeat CBC in AM   Morbid Obesity -Estimated body mass index is 42.98 kg/m as calculated from the following:   Height as of this encounter: 5\' 2"  (1.575 m).   Weight as of this encounter: 106.6 kg. -Weight loss and Dietary Counseling given   DVT prophylaxis: Enoxaparin 40 mg sq qHS Code Status: FULL CODE Family Communication: No family present at bedside  Disposition Plan: Transfer to [facility] for further I and D and workup   Consultants:  Infectious Diseases General Surgery Orthopedic Surgery    Procedures: None   Antimicrobials: Anti-infectives (  From admission, onward)   Start     Dose/Rate Route Frequency Ordered Stop   05/10/19 2000  vancomycin (VANCOCIN) 1,500 mg in sodium chloride 0.9 % 500 mL IVPB     1,500 mg 250 mL/hr over 120 Minutes Intravenous Every 24 hours 05/10/19 0403     05/10/19 0600  piperacillin-tazobactam (ZOSYN) IVPB 3.375 g      3.375 g 12.5 mL/hr over 240 Minutes Intravenous Every 8 hours 05/09/19 2227     05/09/19 2245  vancomycin (VANCOCIN) IVPB 1000 mg/200 mL premix  Status:  Discontinued     1,000 mg 200 mL/hr over 60 Minutes Intravenous  Once 05/09/19 2242 05/09/19 2249   05/09/19 2230  piperacillin-tazobactam (ZOSYN) IVPB 3.375 g     3.375 g 100 mL/hr over 30 Minutes Intravenous  Once 05/09/19 2227 05/10/19 0118   05/09/19 1945  vancomycin (VANCOCIN) 2,000 mg in sodium chloride 0.9 % 500 mL IVPB     2,000 mg 250 mL/hr over 120 Minutes Intravenous  Once 05/09/19 1937 05/09/19 2158   05/09/19 1930  levofloxacin (LEVAQUIN) IVPB 750 mg     750 mg 100 mL/hr over 90 Minutes Intravenous  Once 05/09/19 1926 05/10/19 0036     Subjective: And examined at bedside was still having some pain but denies any chest pain, lightheadedness or dizziness.  No nausea or vomiting.  Thinks her leg is looking slightly better but still draining.  Her main complaint is the pain in the right side of her neck.  I discussed with her about transferring to [facility] she is understandable agreeable with plan  Objective: Vitals:   05/09/19 2302 05/10/19 0055 05/10/19 0214 05/10/19 0636  BP: (!) 165/81   (!) 146/93  Pulse: 99   94  Resp: 19   20  Temp: (!) 100.6 F (38.1 C) (!) 101 F (38.3 C) 99 F (37.2 C) 98.3 F (36.8 C)  TempSrc: Oral  Oral Oral  SpO2: 100%   100%  Weight:      Height:        Intake/Output Summary (Last 24 hours) at 05/10/2019 1610 Last data filed at 05/10/2019 9604 Gross per 24 hour  Intake 650 ml  Output 0 ml  Net 650 ml   Filed Weights   05/09/19 1845  Weight: 106.6 kg   Examination: Physical Exam:  Constitutional: WN/WD morbidly obese Caucasian female in NAD and appears calm but slightly uncomfortable Eyes: Lids and conjunctivae normal, sclerae anicteric  ENMT: External Ears, Nose appear normal. Grossly normal hearing. Mucous membranes are moist.  Neck: Appears normal, supple, no cervical  masses, normal ROM, no appreciable thyromegaly; Has a cellulitic area with a Neck Abscess  Respiratory: Diminished to auscultation bilaterally, no wheezing, rales, rhonchi or crackles. Normal respiratory effort and patient is not tachypenic. No accessory muscle use.  Cardiovascular: RRR, no murmurs / rubs / gallops. S1 and S2 auscultated. 1+ LE extremity edema.  Abdomen: Soft, non-tender, Distended due to body habitus. No masses palpated. No appreciable hepatosplenomegaly. Bowel sounds positive x4.  GU: Deferred. Musculoskeletal: No clubbing / cyanosis of digits/nails. No joint deformity upper and lower extremities.  Skin: Right Neck and Right Leg Abscess and Cellulitis draining   Neurologic: CN 2-12 grossly intact with no focal deficits. Romberg sign and cerebellar reflexes not assessed.  Psychiatric: Normal judgment and insight. Alert and oriented x 3. Normal mood and appropriate affect.   Data Reviewed: I have personally reviewed following labs and imaging studies  CBC: Recent Labs  Lab  05/09/19 1926 05/10/19 0420  WBC 7.6 8.3  NEUTROABS 5.9  --   HGB 11.0* 10.9*  HCT 34.9* 33.5*  MCV 93.6 94.9  PLT 332 300   Basic Metabolic Panel: Recent Labs  Lab 05/09/19 1926 05/10/19 0420  NA 140 138  K 3.1* 3.6  CL 108 107  CO2 26 25  GLUCOSE 92 95  BUN 10 7  CREATININE 0.71 0.61  CALCIUM 8.6* 8.2*  MG  --  1.8  PHOS  --  3.6   GFR: Estimated Creatinine Clearance: 97.6 mL/min (by C-G formula based on SCr of 0.61 mg/dL). Liver Function Tests: No results for input(s): AST, ALT, ALKPHOS, BILITOT, PROT, ALBUMIN in the last 168 hours. No results for input(s): LIPASE, AMYLASE in the last 168 hours. No results for input(s): AMMONIA in the last 168 hours. Coagulation Profile: No results for input(s): INR, PROTIME in the last 168 hours. Cardiac Enzymes: No results for input(s): CKTOTAL, CKMB, CKMBINDEX, TROPONINI in the last 168 hours. BNP (last 3 results) No results for input(s):  PROBNP in the last 8760 hours. HbA1C: No results for input(s): HGBA1C in the last 72 hours. CBG: No results for input(s): GLUCAP in the last 168 hours. Lipid Profile: No results for input(s): CHOL, HDL, LDLCALC, TRIG, CHOLHDL, LDLDIRECT in the last 72 hours. Thyroid Function Tests: No results for input(s): TSH, T4TOTAL, FREET4, T3FREE, THYROIDAB in the last 72 hours. Anemia Panel: No results for input(s): VITAMINB12, FOLATE, FERRITIN, TIBC, IRON, RETICCTPCT in the last 72 hours. Sepsis Labs: Recent Labs  Lab 05/09/19 1926  LATICACIDVEN 0.6    Recent Results (from the past 240 hour(s))  Blood culture (routine x 2)     Status: None (Preliminary result)   Collection Time: 05/09/19  7:26 PM  Result Value Ref Range Status   Specimen Description   Final    BLOOD LEFT ANTECUBITAL Performed at [facility], [address], [place], [place] [address]    Special Requests   Final    BOTTLES DRAWN AEROBIC AND ANAEROBIC Blood Culture results Gorton not be optimal due to an excessive volume of blood received in culture bottles Performed at [facility], [address], [place], [place] [address]    Culture   Final    NO GROWTH < 12 HOURS Performed at [facility], [address] [place], [place] [address]    Report Status PENDING  Incomplete  Blood culture (routine x 2)     Status: None (Preliminary result)   Collection Time: 05/09/19  7:26 PM  Result Value Ref Range Status   Specimen Description   Final    BLOOD RIGHT ANTECUBITAL Performed at [facility], [address], [place], [place] [address]    Special Requests   Final    BOTTLES DRAWN AEROBIC AND ANAEROBIC Blood Culture adequate volume Performed at [facility], [address], [place], [place] [address]    Culture   Final    NO GROWTH < 12 HOURS Performed at [facility], [address] [place], [place] [address]    Report Status  PENDING  Incomplete  SARS Coronavirus 2 (CEPHEID - Performed in [facility] hospital lab), Hosp Order     Status: None   Collection Time: 05/09/19  7:26 PM  Result Value Ref Range Status   SARS Coronavirus 2 NEGATIVE NEGATIVE Final    Comment: (NOTE) If result is NEGATIVE SARS-CoV-2 target nucleic acids are NOT DETECTED. The  SARS-CoV-2 RNA is generally detectable in upper and lower  respiratory specimens during the acute phase of infection. The lowest  concentration of SARS-CoV-2 viral copies this assay can detect is 250  copies / mL. A negative result does not preclude SARS-CoV-2 infection  and should not be used as the sole basis for treatment or other  patient management decisions.  A negative result Burgett occur with  improper specimen collection / handling, submission of specimen other  than nasopharyngeal swab, presence of viral mutation(s) within the  areas targeted by this assay, and inadequate number of viral copies  (<250 copies / mL). A negative result must be combined with clinical  observations, patient history, and epidemiological information. If result is POSITIVE SARS-CoV-2 target nucleic acids are DETECTED. The SARS-CoV-2 RNA is generally detectable in upper and lower  respiratory specimens dur ing the acute phase of infection.  Positive  results are indicative of active infection with SARS-CoV-2.  Clinical  correlation with patient history and other diagnostic information is  necessary to determine patient infection status.  Positive results do  not rule out bacterial infection or co-infection with other viruses. If result is PRESUMPTIVE POSTIVE SARS-CoV-2 nucleic acids Eichhorn BE PRESENT.   A presumptive positive result was obtained on the submitted specimen  and confirmed on repeat testing.  While 2019 novel coronavirus  (SARS-CoV-2) nucleic acids Dockter be present in the submitted sample  additional confirmatory testing Lehtinen be necessary for epidemiological  and / or  clinical management purposes  to differentiate between  SARS-CoV-2 and other Sarbecovirus currently known to infect humans.  If clinically indicated additional testing with an alternate test  methodology [phone]) is advised. The SARS-CoV-2 RNA is generally  detectable in upper and lower respiratory sp ecimens during the acute  phase of infection. The expected result is Negative. Fact Sheet for Patients:  BoilerBrush.com.cy Fact Sheet for Healthcare Providers: https://pope.com/ This test is not yet approved or cleared by the [place] FDA and has been authorized for detection and/or diagnosis of SARS-CoV-2 by FDA under an Emergency Use Authorization (EUA).  This EUA will remain in effect (meaning this test can be used) for the duration of the COVID-19 declaration under Section 564(b)(1) of the Act, 21 U.S.C. section 360bbb-3(b)(1), unless the authorization is terminated or revoked sooner. Performed at [facility], [address] [place], [place] [address]   Wound or Superficial Culture     Status: None (Preliminary result)   Collection Time: 05/09/19  8:52 PM  Result Value Ref Range Status   Specimen Description   Final    WOUND RIGHT LEG Performed at [facility], [address] [place], [place] [address]    Special Requests   Final    Normal Performed at [facility], [address], [place], [place] [address]    Gram Stain   Final    FEW WBC PRESENT,BOTH PMN AND MONONUCLEAR FEW GRAM POSITIVE COCCI Performed at [facility], [address], [place], [place] [address]    Culture PENDING  Incomplete   Report Status PENDING  Incomplete  Aerobic/Anaerobic Culture (surgical/deep wound)     Status: None (Preliminary result)   Collection Time: 05/09/19  8:52 PM  Result Value Ref Range Status   Specimen Description   Final    ABSCESS RIGHT LEG Performed at [facility], [address], [place], [place] [address]    Special Requests   Final    Normal  Performed at [facility], [address] [place], [place] [address]    Gram Stain   Final    ABUNDANT WBC PRESENT, PREDOMINANTLY PMN MODERATE GRAM POSITIVE COCCI Performed at [facility], [address] [place], [place] [address]    Culture PENDING  Incomplete   Report Status PENDING  Incomplete    Radiology Studies: Dg Tibia/fibula Right  Result Date: 05/09/2019 CLINICAL DATA:  Pain status post bite. EXAM: RIGHT TIBIA AND FIBULA - 2 VIEW COMPARISON:  None. FINDINGS: There is nonspecific soft tissue swelling about the right lower extremity. There is no displaced fracture. No dislocation. No radiopaque foreign body. Skin thickening is noted in the proximal pretibial region. IMPRESSION: 1. No acute osseous abnormality. 2. Nonspecific soft tissue swelling about the lower extremity. Clinical correlation is recommended. There is some skin thickening involving the pretibial region at the level of the proximal tibia. Findings can be seen with cellulitis. Electronically Signed   By: Katherine Mantlehristopher  Green M.D.   On: 05/09/2019 19:54   Scheduled Meds:  citalopram  20 mg Oral Daily   enoxaparin (LOVENOX) injection  40 mg Subcutaneous QHS   gabapentin  800 mg Oral TID   metoprolol tartrate  25 mg Oral BID   Continuous Infusions:  piperacillin-tazobactam (ZOSYN)  IV 3.375 g (05/10/19 [id number])   vancomycin      LOS: 1 day   Merlene Laughtermair Latif Daisie Haft, DO [facility] PAGER is on AMION  If 7PM-7AM, please contact night-coverage [email or web address] Password [email or web address] 05/10/2019, 7:27 AM

## 2019-05-10 NOTE — Consult Note (Signed)
ORTHOPAEDIC CONSULTATION  REQUESTING PHYSICIAN: Merlene LaughterSheikh, Omair Latif, DO  Chief Complaint: Right lower leg abscess, cellulitis  HPI: Denny PeonKim M Winther is a 48 y.o. female who presents with RLE abscess, drainage for 1 week.  She is homeless and lives in a tent.  She assumed they were ant bites.  The spots on her RLE became larger and more painful over the last few days.  She also has an abscess in her neck that general surgery is consulting on.  Ortho consulted for RLE issues. Denies any IVDU.  Past Medical History:  Diagnosis Date  . Kidney stones    History reviewed. No pertinent surgical history. Social History   Socioeconomic History  . Marital status: Single    Spouse name: Not on file  . Number of children: Not on file  . Years of education: Not on file  . Highest education level: Not on file  Occupational History  . Not on file  Social Needs  . Financial resource strain: Not on file  . Food insecurity:    Worry: Not on file    Inability: Not on file  . Transportation needs:    Medical: Not on file    Non-medical: Not on file  Tobacco Use  . Smoking status: Never Smoker  . Smokeless tobacco: Never Used  Substance and Sexual Activity  . Alcohol use: No  . Drug use: No  . Sexual activity: Not on file  Lifestyle  . Physical activity:    Days per week: Not on file    Minutes per session: Not on file  . Stress: Not on file  Relationships  . Social connections:    Talks on phone: Not on file    Gets together: Not on file    Attends religious service: Not on file    Active member of club or organization: Not on file    Attends meetings of clubs or organizations: Not on file    Relationship status: Not on file  Other Topics Concern  . Not on file  Social History Narrative  . Not on file   History reviewed. No pertinent family history. - negative except otherwise stated in the family history section No Known Allergies Prior to Admission medications   Medication Sig  Start Date End Date Taking? Authorizing Provider  citalopram (CELEXA) 20 MG tablet Take 20 mg by mouth daily.   Yes [provider]  gabapentin (NEURONTIN) 800 MG tablet Take 800 mg by mouth 3 (three) times daily.   Yes [provider]  metoprolol tartrate (LOPRESSOR) 25 MG tablet Take 25 mg by mouth 2 (two) times daily.   Yes [provider]  doxycycline (VIBRAMYCIN) 100 MG capsule Take 1 capsule (100 mg total) by mouth 2 (two) times daily. Patient not taking: Reported on 05/09/2019 09/12/17   Dione BoozeGlick, David, MD  oxyCODONE-acetaminophen (ROXICET) 5-325 MG tablet Take 1-2 tablets by mouth every 4 (four) hours as needed for severe pain. Patient not taking: Reported on 05/09/2019 09/12/17   Dione BoozeGlick, David, MD   Dg Tibia/fibula Right  Result Date: 05/09/2019 CLINICAL DATA:  Pain status post bite. EXAM: RIGHT TIBIA AND FIBULA - 2 VIEW COMPARISON:  None. FINDINGS: There is nonspecific soft tissue swelling about the right lower extremity. There is no displaced fracture. No dislocation. No radiopaque foreign body. Skin thickening is noted in the proximal pretibial region. IMPRESSION: 1. No acute osseous abnormality. 2. Nonspecific soft tissue swelling about the lower extremity. Clinical correlation is recommended. There is  some skin thickening involving the pretibial region at the level of the proximal tibia. Findings can be seen with cellulitis. Electronically Signed   By: Katherine Mantle M.D.   On: 05/09/2019 19:54   Ct Soft Tissue Neck Wo Contrast  Result Date: 05/10/2019 CLINICAL DATA:  Purulent cellulitis of the right lateral neck and right lower leg after being bitten by ants and then swimming in a river per the patient. Patient having drainage from the neck abscess, bloody and golden tinged fluid. EXAM: CT NECK WITHOUT CONTRAST TECHNIQUE: Multidetector CT imaging of the neck was performed following the standard protocol without intravenous contrast. COMPARISON:  None. FINDINGS:  Pharynx and larynx: Normal. No mass or swelling. Salivary glands: No inflammation, mass, or stone. Thyroid: Normal. Lymph nodes: Reactive level 2 and level 5 lymph nodes on the RIGHT. Vascular: Cannot assess for patency or thrombus. Limited intracranial: Negative Visualized orbits: Negative Mastoids and visualized paranasal sinuses: Negative Skeleton: Negative Upper chest: Negative Other: Extensive cellulitis affects the posterior triangle of the neck. There is skin thickening, with a phlegmon or abscess, 21 x 19 mm, deep to the area superficial drainage. Beyond this, there is extensive stranding of the subcutaneous and deep fat, and the RIGHT posterior nuchal musculature and sternocleidomastoid are enlarged, suggesting myositis IMPRESSION: Extensive cellulitis affecting the RIGHT posterior triangle of the neck, with phlegmon or abscess, 21 x 19 mm deep to the area superficial drainage. Extensive stranding of the subcutaneous and deep fat, and   enlargement of the RIGHT posterior nuchal musculature and sternocleidomastoid suggesting myositis. CT of the neck with contrast is recommended for further evaluation to evaluate for presence or absence of abscesses, as well as potential complication internal jugular vein thrombosis. Electronically Signed   By: Elsie Stain M.D.   On: 05/10/2019 13:39   - pertinent xrays, CT, MRI studies were reviewed and independently interpreted  Positive ROS: All other systems have been reviewed and were otherwise negative with the exception of those mentioned in the HPI and as above.  Physical Exam: General: Alert, no acute distress Cardiovascular: No pedal edema Respiratory: No cyanosis, no use of accessory musculature GI: No organomegaly, abdomen is soft and non-tender Skin: No lesions in the area of chief complaint Neurologic: Sensation intact distally Psychiatric: Patient is competent for consent with normal mood and affect Lymphatic: No axillary or cervical  lymphadenopathy  MUSCULOSKELETAL:  See clinical photo  Assessment: RLE abscess  Plan: - clinical findings are c/w deep abscess - will need I&D in [facility] tomorrow [facility] - appreciate transfer - continue abx per ID - will obtain intraop cultures to help direct abx treatment - patient consented for procedure  Thank you for the consult and the opportunity to see Ms. Jarecki  N. Glee Arvin, MD [phone] 5:13 PM

## 2019-05-10 NOTE — Progress Notes (Signed)
Pt arrived to room 5N30 via [facility]. See assessment. Will continue to monitor.

## 2019-05-10 NOTE — Progress Notes (Signed)
Pharmacy Antibiotic Note  Kathe Campa Salsgiver is a 48 y.o. female admitted on 05/09/2019 with cellulitis.  Pharmacy has been consulted for Vancomycin and Zosyn dosing.  Plan: Vancomycin 2gm iv x1 (1958), then Vancomycin 1500 mg IV Q 24 hrs. Goal AUC 400-550. Expected AUC: 467 SCr used: 0.8 (adjusted) Zosyn 3.375g IV Q8H infused over 4hrs.   Height: 5\' 2"  (157.5 cm) Weight: 235 lb (106.6 kg) IBW/kg (Calculated) : 50.1  Temp (24hrs), Avg:99.8 F (37.7 C), Min:98.7 F (37.1 C), Max:101 F (38.3 C)  Recent Labs  Lab 05/09/19 1926  WBC 7.6  CREATININE 0.71  LATICACIDVEN 0.6    Estimated Creatinine Clearance: 97.6 mL/min (by C-G formula based on SCr of 0.71 mg/dL).    No Known Allergies  Antimicrobials this admission: Vancomycin 05/10/2019 >> Zosyn 05/10/2019 >>   Dose adjustments this admission: -  Microbiology results: -  Thank you for allowing pharmacy to be a part of this patient's care.  Aleene Davidson Crowford 05/10/2019 4:00 AM

## 2019-05-10 NOTE — Progress Notes (Signed)
Report called and given to RN on 5N at [facility].

## 2019-05-10 NOTE — Progress Notes (Signed)
I am aware of the patient and will see her this afternoon.  Based on clinical pictures in the chart, she will most likely need formal I&D in the OR. I have discussed this with Dr. Marland Mcalpine and have requested transfer to [facility] for I&D tomorrow.

## 2019-05-10 NOTE — Consult Note (Signed)
[facility]    Date of Admission:  05/09/2019           Day 1 vancomycin        Day 1 piperacillin tazobactam       Reason for Consult: Soft tissue abscesses    Referring Provider: Dr. Marland Mcalpine  Assessment: She has recurrent soft tissue boils and abscesses.  Her right leg abscess is growing staph aureus.  I agree with consideration of further incision and drainage.  Continue vancomycin alone for now.  Plan: 1. Continue vancomycin 2. Discontinue piperacillin tazobactam  Principal Problem:   Soft tissue abscess Active Problems:   Normocytic anemia   Psoriasis   History of nephrolithiasis   Obesity   Neuropathy   Hypertension   Scheduled Meds: . citalopram  20 mg Oral Daily  . enoxaparin (LOVENOX) injection  40 mg Subcutaneous QHS  . gabapentin  800 mg Oral TID  . metoprolol tartrate  25 mg Oral BID   Continuous Infusions: . piperacillin-tazobactam (ZOSYN)  IV 3.375 g (05/10/19 1411)  . vancomycin     PRN Meds:.acetaminophen **OR** acetaminophen, albuterol, bisacodyl, ipratropium, magnesium citrate, morphine injection, ondansetron **OR** ondansetron (ZOFRAN) IV, senna-docusate, traMADol  HPI: Brianna Randall is a 48 y.o. female who noted several spots on her right shin about 1 week ago.  She assumed that they were ant bites.  She and her husband live in a tent.  They travel back and forth between [place] in [place] frequently.  [place] after noticing the spots.  She also noted an area on the right side of her neck.  Both of these areas started to enlarge and become painful leading to admission yesterday.  She was seen in the emergency department 2 years ago with an abscess in her right thigh.  It was not cultured.  She also recalls developing a bad boil on her upper lip several months ago.  It resolved spontaneously.   Review of Systems: Review of Systems  Constitutional: Negative for chills, diaphoresis,  fever and weight loss.  HENT: Negative for congestion and sore throat.   Respiratory: Negative for cough, sputum production and shortness of breath.   Cardiovascular: Negative for chest pain.  Gastrointestinal: Negative for abdominal pain, diarrhea, nausea and vomiting.  Genitourinary: Negative for dysuria.  Skin: Negative for itching and rash.    Past Medical History:  Diagnosis Date  . Kidney stones     Social History   Tobacco Use  . Smoking status: Never Smoker  . Smokeless tobacco: Never Used  Substance Use Topics  . Alcohol use: No  . Drug use: No    History reviewed. No pertinent family history. No Known Allergies  OBJECTIVE: Blood pressure (!) 150/96, pulse 96, temperature 98.2 F (36.8 C), temperature source Axillary, resp. rate 17, height  (1.575 m), weight 106.6 kg, SpO2 99 %.  Physical Exam Constitutional:      Comments: She is resting quietly in bed.  She is in good spirits.  HENT:     Head:     Comments: She has a large, tender abscess on her right posterior neck with overlying cellulitis.    Mouth/Throat:     Pharynx: No oropharyngeal exudate.  Eyes:     Conjunctiva/sclera: Conjunctivae normal.  Cardiovascular:     Rate and Rhythm: Normal rate and regular rhythm.     Heart sounds: No murmur.  Pulmonary:  Effort: Pulmonary effort is normal.     Breath sounds: Normal breath sounds.  Abdominal:     Palpations: Abdomen is soft.     Tenderness: There is no abdominal tenderness.  Skin:    Comments: She has what appears to be a large abscess with overlying cellulitis on her right shin.  She has thickened, scaly skin below her right knee compatible with eczema or psoriasis.  Psychiatric:        Mood and Affect: Mood normal.     Lab Results Lab Results  Component Value Date   WBC 8.3 05/10/2019   HGB 10.9 (L) 05/10/2019   HCT 33.5 (L) 05/10/2019   MCV 94.9 05/10/2019   PLT 300 05/10/2019    Lab Results  Component Value Date    CREATININE 0.61 05/10/2019   BUN 7 05/10/2019   NA 138 05/10/2019   K 3.6 05/10/2019   CL 107 05/10/2019   CO2 25 05/10/2019    Lab Results  Component Value Date   ALT 36 09/11/2017   AST 41 09/11/2017   ALKPHOS 144 (H) 09/11/2017   BILITOT 0.5 09/11/2017     Microbiology: Recent Results (from the past 240 hour(s))  Blood culture (routine x 2)     Status: None (Preliminary result)   Collection Time: 05/09/19  7:26 PM  Result Value Ref Range Status   Specimen Description   Final    BLOOD LEFT ANTECUBITAL Performed at [facility], [address] [place], [place] [address]    Special Requests   Final    BOTTLES DRAWN AEROBIC AND ANAEROBIC Blood Culture results [place] not be optimal due to an excessive volume of blood received in culture bottles Performed at [facility], [address], [place], [place] [address]    Culture   Final    NO GROWTH < 12 HOURS Performed at [facility], [address], [place], [place] [address]    Report Status PENDING  Incomplete  Blood culture (routine x 2)     Status: None (Preliminary result)   Collection Time: 05/09/19  7:26 PM  Result Value Ref Range Status   Specimen Description   Final    BLOOD RIGHT ANTECUBITAL Performed at [facility], [address], [place], [place] [address]    Special Requests   Final    BOTTLES DRAWN AEROBIC AND ANAEROBIC Blood Culture adequate volume Performed at [facility], [address], [place], [place] [address]    Culture   Final    NO GROWTH < 12 HOURS Performed at [facility], [address] [place], [place] [address]    Report Status PENDING  Incomplete  SARS Coronavirus 2 (CEPHEID - Performed in [facility] hospital lab), Hosp Order     Status: None   Collection Time: 05/09/19  7:26 PM  Result Value Ref Range Status   SARS Coronavirus 2 NEGATIVE NEGATIVE Final    Comment: (NOTE) If result is NEGATIVE  SARS-CoV-2 target nucleic acids are NOT DETECTED. The SARS-CoV-2 RNA is generally detectable in upper and lower  respiratory specimens during the acute phase of infection. The lowest  concentration of SARS-CoV-2 viral copies this assay can detect is 250  copies / mL. A negative result does not preclude SARS-CoV-2 infection  and should not be used as the sole basis for treatment or other  patient management decisions.  A negative result Dahm occur with  improper specimen collection /  handling, submission of specimen other  than nasopharyngeal swab, presence of viral mutation(s) within the  areas targeted by this assay, and inadequate number of viral copies  (<250 copies / mL). A negative result must be combined with clinical  observations, patient history, and epidemiological information. If result is POSITIVE SARS-CoV-2 target nucleic acids are DETECTED. The SARS-CoV-2 RNA is generally detectable in upper and lower  respiratory specimens dur ing the acute phase of infection.  Positive  results are indicative of active infection with SARS-CoV-2.  Clinical  correlation with patient history and other diagnostic information is  necessary to determine patient infection status.  Positive results do  not rule out bacterial infection or co-infection with other viruses. If result is PRESUMPTIVE POSTIVE SARS-CoV-2 nucleic acids Radice BE PRESENT.   A presumptive positive result was obtained on the submitted specimen  and confirmed on repeat testing.  While 2019 novel coronavirus  (SARS-CoV-2) nucleic acids Bazaldua be present in the submitted sample  additional confirmatory testing Pupo be necessary for epidemiological  and / or clinical management purposes  to differentiate between  SARS-CoV-2 and other Sarbecovirus currently known to infect humans.  If clinically indicated additional testing with an alternate test  methodology [phone]) is advised. The SARS-CoV-2 RNA is generally  detectable in upper  and lower respiratory sp ecimens during the acute  phase of infection. The expected result is Negative. Fact Sheet for Patients:  BoilerBrush.com.cy Fact Sheet for Healthcare Providers: https://pope.com/ This test is not yet approved or cleared by the [place] FDA and has been authorized for detection and/or diagnosis of SARS-CoV-2 by FDA under an Emergency Use Authorization (EUA).  This EUA will remain in effect (meaning this test can be used) for the duration of the COVID-19 declaration under Section 564(b)(1) of the Act, 21 U.S.C. section 360bbb-3(b)(1), unless the authorization is terminated or revoked sooner. Performed at [facility], [address], [place], [place] [address]   Wound or Superficial Culture     Status: None (Preliminary result)   Collection Time: 05/09/19  8:52 PM  Result Value Ref Range Status   Specimen Description   Final    WOUND RIGHT LEG Performed at [facility], [address] [place], [place] [address]    Special Requests   Final    Normal Performed at [facility], [address], [place], [place] [address]    Gram Stain   Final    FEW WBC PRESENT,BOTH PMN AND MONONUCLEAR FEW GRAM POSITIVE COCCI    Culture   Final    MODERATE STAPHYLOCOCCUS AUREUS SUSCEPTIBILITIES TO FOLLOW Performed at [facility], [address], [place], [place] [address]    Report Status PENDING  Incomplete  Aerobic/Anaerobic Culture (surgical/deep wound)     Status: None (Preliminary result)   Collection Time: 05/09/19  8:52 PM  Result Value Ref Range Status   Specimen Description   Final    ABSCESS RIGHT LEG Performed at [facility], [address] [place], [place] [address]    Special Requests   Final    Normal Performed at [facility], [address], [place], [place] [address]    Gram Stain   Final     ABUNDANT WBC PRESENT, PREDOMINANTLY PMN MODERATE GRAM POSITIVE COCCI    Culture   Final    MODERATE STAPHYLOCOCCUS AUREUS SUSCEPTIBILITIES TO FOLLOW Performed at [facility], [address], [place], [place] [address]  Report Status PENDING  Incomplete    Cliffton AstersJohn Ekaterina Denise, MD [facility] [phone] pager   [phone] cell 05/10/2019, 3:29 PM

## 2019-05-10 NOTE — Plan of Care (Signed)
?  Problem: Education: ?Goal: Knowledge of General Education information will improve ?Description: Including pain rating scale, medication(s)/side effects and non-pharmacologic comfort measures ?Outcome: Progressing ?  ?Problem: Health Behavior/Discharge Planning: ?Goal: Ability to manage health-related needs will improve ?Outcome: Progressing ?  ?Problem: Clinical Measurements: ?Goal: Ability to maintain clinical measurements within normal limits will improve ?Outcome: Progressing ?Goal: Respiratory complications will improve ?Outcome: Progressing ?Goal: Cardiovascular complication will be avoided ?Outcome: Progressing ?  ?Problem: Nutrition: ?Goal: Adequate nutrition will be maintained ?Outcome: Progressing ?  ?Problem: Coping: ?Goal: Level of anxiety will decrease ?Outcome: Progressing ?  ?Problem: Elimination: ?Goal: Will not experience complications related to bowel motility ?Outcome: Progressing ?Goal: Will not experience complications related to urinary retention ?Outcome: Progressing ?  ?Problem: Pain Managment: ?Goal: General experience of comfort will improve ?Outcome: Progressing ?  ?Problem: Safety: ?Goal: Ability to remain free from injury will improve ?Outcome: Progressing ?  ?Problem: Skin Integrity: ?Goal: Risk for impaired skin integrity will decrease ?Outcome: Progressing ?  ?

## 2019-05-10 NOTE — Consult Note (Signed)
[facility] [place] [facility] Consult Note  Brianna Randall November 12, 1971  [id number].    Requesting MD: Marguerita Merles  Reason for Consult: neck and knee skin infection  HPI: Is a 48 year old female who reports being near [place].  She reported noticing ant bites on Thursday and Friday to her lower leg and right neck.  Patient has a history of neuropathy, she is homeless.  She reports the skin infections are becoming worse.  She noticed them about 6 days ago.  She is also reports fresh water exposure and swimming in a river.  She is developed redness and pain in her right lower leg as well as her right lateral neck which is become significantly worse over the last 24 to 48 hours.  She denied IV drug use.  She does use marijuana occasionally.  Work-up in the ED shows she is febrile with a temperature to 101.  Blood pressure is also slightly elevated.  Admission labs shows a potassium of 3.1, the remainder of the BMP is normal.  Lactate is 0.6, WBC 7.6, hemoglobin 11.0, hematocrit 34.9, platelets 332,000.  She is COVID negative. Aerobic cultures: Gram Stain FEW WBC PRESENT,BOTH PMN AND MONONUCLEAR  FEW GRAM POSITIVE COCCI  Blood cultures shows no growth so far     Draining and matted hair in the site (3.5 cm diameter with another 2 mm induration)   S/p I&D in ER, packing removed  She had a second site that is about to drain also seen in the picture.  ROS: Review of Systems  Constitutional: Negative.   HENT: Negative.   Eyes: Negative.   Respiratory: Negative.   Cardiovascular: Negative.   Gastrointestinal: Negative.   Genitourinary: Negative.   Musculoskeletal:       She has lower extremity neuropathy  Skin:       See pictures  Neurological: Negative.   Endo/Heme/Allergies: Negative.   Psychiatric/Behavioral: Negative.     History reviewed. No pertinent family history.  Past Medical History:  Diagnosis Date  . Kidney stones   Hypertension Neuropathy Homeless  History reviewed. No  pertinent surgical history.  Social History:  reports that she has never smoked. She has never used smokeless tobacco. She reports that she does not drink alcohol or use drugs.  Allergies: No Known Allergies  Medications Prior to Admission  Medication Sig Dispense Refill  . citalopram (CELEXA) 20 MG tablet Take 20 mg by mouth daily.    Marland Kitchen gabapentin (NEURONTIN) 800 MG tablet Take 800 mg by mouth 3 (three) times daily.    . metoprolol tartrate (LOPRESSOR) 25 MG tablet Take 25 mg by mouth 2 (two) times daily.    Marland Kitchen doxycycline (VIBRAMYCIN) 100 MG capsule Take 1 capsule (100 mg total) by mouth 2 (two) times daily. (Patient not taking: Reported on 05/09/2019) 20 capsule 0  . oxyCODONE-acetaminophen (ROXICET) 5-325 MG tablet Take 1-2 tablets by mouth every 4 (four) hours as needed for severe pain. (Patient not taking: Reported on 05/09/2019) 15 tablet 0    Blood pressure (!) 146/93, pulse 94, temperature 98.3 F (36.8 C), temperature source Oral, resp. rate 20, height  (1.575 m), weight 106.6 kg, SpO2 100 %. Physical Exam: Physical Exam Constitutional:      General: She is not in acute distress.    Appearance: Normal appearance. She is not ill-appearing or toxic-appearing.  HENT:     Head: Normocephalic and atraumatic.      Comments: 3 cm open and draining ulcer with 2 cm of induration around it.  Nose: Nose normal.     Mouth/Throat:     Pharynx: No oropharyngeal exudate.  Eyes:     General: No scleral icterus.       Right eye: No discharge.        Left eye: No discharge.     Comments: Pupils are equal  Neck:     Musculoskeletal: Normal range of motion and neck supple. No neck rigidity or muscular tenderness.     Vascular: No carotid bruit.  Cardiovascular:     Rate and Rhythm: Normal rate and regular rhythm.     Pulses: Normal pulses.  Pulmonary:     Effort: Pulmonary effort is normal.     Breath sounds: Normal breath sounds.  Abdominal:     General: Abdomen is flat. Bowel  sounds are normal.     Palpations: Abdomen is soft.  Musculoskeletal: Normal range of motion.        General: Swelling and tenderness present.  Lymphadenopathy:     Cervical: No cervical adenopathy.  Skin:    General: Skin is warm and dry.     Capillary Refill: Capillary refill takes less than 2 seconds.          Comments: See pictures below  Neurological:     General: No focal deficit present.     Mental Status: She is alert and oriented to person, place, and time.     Comments: She has lower leg neuropathy from back injury in the past  Psychiatric:        Mood and Affect: Mood normal.        Behavior: Behavior normal.        Thought Content: Thought content normal.        Judgment: Judgment normal.     Results for orders placed or performed during the hospital encounter of 05/09/19 (from the past 48 hour(s))  CBC with Differential/Platelet     Status: Abnormal   Collection Time: 05/09/19  7:26 PM  Result Value Ref Range   WBC 7.6 4.0 - 10.5 K/uL   RBC 3.73 (L) 3.87 - 5.11 MIL/uL   Hemoglobin 11.0 (L) 12.0 - 15.0 g/dL   HCT 03.5 (L) 59.7 - 41.6 %   MCV 93.6 80.0 - 100.0 fL   MCH 29.5 26.0 - 34.0 pg   MCHC 31.5 30.0 - 36.0 g/dL   RDW 38.4 53.6 - 46.8 %   Platelets 332 150 - 400 K/uL   nRBC 0.0 0.0 - 0.2 %   Neutrophils Relative % 79 %   Neutro Abs 5.9 1.7 - 7.7 K/uL   Lymphocytes Relative 11 %   Lymphs Abs 0.8 0.7 - 4.0 K/uL   Monocytes Relative 8 %   Monocytes Absolute 0.6 0.1 - 1.0 K/uL   Eosinophils Relative 2 %   Eosinophils Absolute 0.2 0.0 - 0.5 K/uL   Basophils Relative 0 %   Basophils Absolute 0.0 0.0 - 0.1 K/uL   Immature Granulocytes 0 %   Abs Immature Granulocytes 0.03 0.00 - 0.07 K/uL    Comment: Performed at [facility], [address] [place], [place] [address]  Basic metabolic panel     Status: Abnormal   Collection Time: 05/09/19  7:26 PM  Result Value Ref Range   Sodium 140 135 - 145 mmol/L   Potassium 3.1 (L) 3.5 - 5.1  mmol/L   Chloride 108 98 - 111 mmol/L   CO2 26 22 - 32 mmol/L   Glucose, Bld 92 70 -  99 mg/dL   BUN 10 6 - 20 mg/dL   Creatinine, Ser 1.610.71 0.44 - 1.00 mg/dL   Calcium 8.6 (L) 8.9 - 10.3 mg/dL   GFR calc non Af Amer >60 >60 mL/min   GFR calc Af Amer >60 >60 mL/min   Anion gap 6 5 - 15    Comment: Performed at [facility], [address] [place], [place] [address]  Blood culture (routine x 2)     Status: None (Preliminary result)   Collection Time: 05/09/19  7:26 PM  Result Value Ref Range   Specimen Description      BLOOD LEFT ANTECUBITAL Performed at [facility], [address] [place], [place] [address]    Special Requests      BOTTLES DRAWN AEROBIC AND ANAEROBIC Blood Culture results Kamath not be optimal due to an excessive volume of blood received in culture bottles Performed at [facility], [address] [place], [place] [address]    Culture      NO GROWTH < 12 HOURS Performed at [facility], [address] [place], [place] [address]    Report Status PENDING   Blood culture (routine x 2)     Status: None (Preliminary result)   Collection Time: 05/09/19  7:26 PM  Result Value Ref Range   Specimen Description      BLOOD RIGHT ANTECUBITAL Performed at [facility], [address] [place], [place] [address]    Special Requests      BOTTLES DRAWN AEROBIC AND ANAEROBIC Blood Culture adequate volume Performed at [facility], [address] [place], [place] [address]    Culture      NO GROWTH < 12 HOURS Performed at [facility], [address] [place], [place] [address]    Report Status PENDING   Lactic acid, plasma     Status: None   Collection Time: 05/09/19  7:26 PM  Result Value Ref Range   Lactic Acid, Venous 0.6 0.5 - 1.9 mmol/L    Comment: Performed at [facility], [address] [place], [place] [address]  SARS Coronavirus 2  (CEPHEID - Performed in [facility] hospital lab), Hosp Order     Status: None   Collection Time: 05/09/19  7:26 PM  Result Value Ref Range   SARS Coronavirus 2 NEGATIVE NEGATIVE    Comment: (NOTE) If result is NEGATIVE SARS-CoV-2 target nucleic acids are NOT DETECTED. The SARS-CoV-2 RNA is generally detectable in upper and lower  respiratory specimens during the acute phase of infection. The lowest  concentration of SARS-CoV-2 viral copies this assay can detect is 250  copies / mL. A negative result does not preclude SARS-CoV-2 infection  and should not be used as the sole basis for treatment or other  patient management decisions.  A negative result Kurka occur with  improper specimen collection / handling, submission of specimen other  than nasopharyngeal swab, presence of viral mutation(s) within the  areas targeted by this assay, and inadequate number of viral copies  (<250 copies / mL). A negative result must be combined with clinical  observations, patient history, and epidemiological information. If result is POSITIVE SARS-CoV-2 target nucleic acids are DETECTED. The SARS-CoV-2 RNA is generally detectable in upper and lower  respiratory specimens dur ing the acute phase of infection.  Positive  results are indicative of active infection with SARS-CoV-2.  Clinical  correlation with patient history and  other diagnostic information is  necessary to determine patient infection status.  Positive results do  not rule out bacterial infection or co-infection with other viruses. If result is PRESUMPTIVE POSTIVE SARS-CoV-2 nucleic acids Krejci BE PRESENT.   A presumptive positive result was obtained on the submitted specimen  and confirmed on repeat testing.  While 2019 novel coronavirus  (SARS-CoV-2) nucleic acids Menge be present in the submitted sample  additional confirmatory testing Quain be necessary for epidemiological  and / or clinical management purposes  to differentiate between   SARS-CoV-2 and other Sarbecovirus currently known to infect humans.  If clinically indicated additional testing with an alternate test  methodology [phone]) is advised. The SARS-CoV-2 RNA is generally  detectable in upper and lower respiratory sp ecimens during the acute  phase of infection. The expected result is Negative. Fact Sheet for Patients:  BoilerBrush.com.cy Fact Sheet for Healthcare Providers: https://pope.com/ This test is not yet approved or cleared by the [place] FDA and has been authorized for detection and/or diagnosis of SARS-CoV-2 by FDA under an Emergency Use Authorization (EUA).  This EUA will remain in effect (meaning this test can be used) for the duration of the COVID-19 declaration under Section 564(b)(1) of the Act, 21 U.S.C. section 360bbb-3(b)(1), unless the authorization is terminated or revoked sooner. Performed at [facility], [address] [place], [place] [address]   Wound or Superficial Culture     Status: None (Preliminary result)   Collection Time: 05/09/19  8:52 PM  Result Value Ref Range   Specimen Description      WOUND RIGHT LEG Performed at [facility], [address] [place], [place] [address]    Special Requests      Normal Performed at [facility], [address], [place], [place] [address]    Gram Stain      FEW WBC PRESENT,BOTH PMN AND MONONUCLEAR FEW GRAM POSITIVE COCCI Performed at [facility], [address] [place], [place] [address]    Culture PENDING    Report Status PENDING   Aerobic/Anaerobic Culture (surgical/deep wound)     Status: None (Preliminary result)   Collection Time: 05/09/19  8:52 PM  Result Value Ref Range   Specimen Description      ABSCESS RIGHT LEG Performed at [facility], [address] [place], [place] [address]    Special Requests      Normal Performed  at [facility], [address] [place], [place] [address]    Gram Stain      ABUNDANT WBC PRESENT, PREDOMINANTLY PMN MODERATE GRAM POSITIVE COCCI Performed at [facility], [address], [place], [place] [address]    Culture PENDING    Report Status PENDING   Magnesium     Status: None   Collection Time: 05/10/19  4:20 AM  Result Value Ref Range   Magnesium 1.8 1.7 - 2.4 mg/dL    Comment: Performed at [facility], [address], [place], [place] [address]  Phosphorus     Status: None   Collection Time: 05/10/19  4:20 AM  Result Value Ref Range   Phosphorus 3.6 2.5 - 4.6 mg/dL    Comment: Performed at [facility], [address], [place], [place] [address]  Basic metabolic panel     Status: Abnormal   Collection Time: 05/10/19  4:20 AM  Result Value Ref Range   Sodium 138 135 - 145  mmol/L   Potassium 3.6 3.5 - 5.1 mmol/L   Chloride 107 98 - 111 mmol/L   CO2 25 22 - 32 mmol/L   Glucose, Bld 95 70 - 99 mg/dL   BUN 7 6 - 20 mg/dL   Creatinine, Ser 0.45 0.44 - 1.00 mg/dL   Calcium 8.2 (L) 8.9 - 10.3 mg/dL   GFR calc non Af Amer >60 >60 mL/min   GFR calc Af Amer >60 >60 mL/min   Anion gap 6 5 - 15    Comment: Performed at [facility], [address], [place], [place] [address]  CBC     Status: Abnormal   Collection Time: 05/10/19  4:20 AM  Result Value Ref Range   WBC 8.3 4.0 - 10.5 K/uL   RBC 3.53 (L) 3.87 - 5.11 MIL/uL   Hemoglobin 10.9 (L) 12.0 - 15.0 g/dL   HCT 19.1 (L) 47.8 - 29.5 %   MCV 94.9 80.0 - 100.0 fL   MCH 30.9 26.0 - 34.0 pg   MCHC 32.5 30.0 - 36.0 g/dL   RDW 62.1 30.8 - 65.7 %   Platelets 300 150 - 400 K/uL   nRBC 0.0 0.0 - 0.2 %    Comment: Performed at [facility], [address] [place], [place] [address]   Dg Tibia/fibula Right  Result Date: 05/09/2019 CLINICAL DATA:  Pain status post bite. EXAM: RIGHT TIBIA AND FIBULA - 2 VIEW COMPARISON:   None. FINDINGS: There is nonspecific soft tissue swelling about the right lower extremity. There is no displaced fracture. No dislocation. No radiopaque foreign body. Skin thickening is noted in the proximal pretibial region. IMPRESSION: 1. No acute osseous abnormality. 2. Nonspecific soft tissue swelling about the lower extremity. Clinical correlation is recommended. There is some skin thickening involving the pretibial region at the level of the proximal tibia. Findings can be seen with cellulitis. Electronically Signed   By: Katherine Mantle M.D.   On: 05/09/2019 19:54   . piperacillin-tazobactam (ZOSYN)  IV 3.375 g (05/10/19 2952)  . vancomycin        Assessment/Plan Homeless Hypertension  Lower extremity neuropathy  Abscess right posterior neck Abscess right lower leg   FEN: regular diet ID: Zosyn/ Vancomycin 6/2 >> day 2 DVT:  None Follow up:  TBD POC:  Pt is homeless and Spouse lives in a tent.  Plan:  Agree with wide spectrum antibiotics, we are getting a CT of the neck to see if there is anything below the skin.  Defer lower leg to Orthopedics.  Cultures are pending.     Sherrie George [facility] Surgery 05/10/2019, 9:00 AM Pager: [phone] Consults: [phone]

## 2019-05-11 ENCOUNTER — Encounter (HOSPITAL_COMMUNITY)
Admission: EM | Disposition: A | Discharge status: Home / Self Care | Payer: Self-pay | Source: Home / Self Care | Attending: Internal Medicine

## 2019-05-11 ENCOUNTER — Encounter (HOSPITAL_COMMUNITY): Payer: Self-pay | Admitting: Certified Registered Nurse Anesthetist

## 2019-05-11 ENCOUNTER — Inpatient Hospital Stay (HOSPITAL_COMMUNITY): Payer: Self-pay | Admitting: Anesthesiology

## 2019-05-11 DIAGNOSIS — L02416 Cutaneous abscess of left lower limb: Secondary | ICD-10-CM

## 2019-05-11 DIAGNOSIS — Z590 Homelessness: Secondary | ICD-10-CM

## 2019-05-11 DIAGNOSIS — L02415 Cutaneous abscess of right lower limb: Secondary | ICD-10-CM

## 2019-05-11 DIAGNOSIS — L03115 Cellulitis of right lower limb: Principal | ICD-10-CM

## 2019-05-11 DIAGNOSIS — G629 Polyneuropathy, unspecified: Secondary | ICD-10-CM

## 2019-05-11 DIAGNOSIS — L0291 Cutaneous abscess, unspecified: Secondary | ICD-10-CM

## 2019-05-11 HISTORY — PX: INCISION AND DRAINAGE OF WOUND: SHX1803

## 2019-05-11 HISTORY — PX: I & D EXTREMITY: SHX5045

## 2019-05-11 LAB — MRSA PCR SCREENING: MRSA by PCR: NEGATIVE

## 2019-05-11 LAB — PREGNANCY, URINE: Preg Test, Ur: NEGATIVE

## 2019-05-11 SURGERY — IRRIGATION AND DEBRIDEMENT EXTREMITY
Anesthesia: General | Site: Neck | Laterality: Right

## 2019-05-11 MED ORDER — CEFAZOLIN SODIUM-DEXTROSE 2-4 GM/100ML-% IV SOLN
2.0000 g | Freq: Three times a day (TID) | INTRAVENOUS | Status: DC
  Administered 2019-05-11 – 2019-05-16 (×15): 2 g via INTRAVENOUS
  Filled 2019-05-11 (×19): qty 100

## 2019-05-11 MED ORDER — ACETAMINOPHEN 10 MG/ML IV SOLN: 1000.0000 mg | Freq: Once | INTRAVENOUS | Status: DC | PRN

## 2019-05-11 MED ORDER — OXYCODONE HCL 5 MG PO TABS: 5.0000 mg | ORAL_TABLET | Freq: Once | ORAL | Status: DC | PRN

## 2019-05-11 MED ORDER — ONDANSETRON HCL 4 MG/2ML IJ SOLN
INTRAMUSCULAR | Status: AC
  Filled 2019-05-11: qty 2

## 2019-05-11 MED ORDER — EPHEDRINE SULFATE-NACL 50-0.9 MG/10ML-% IV SOSY
PREFILLED_SYRINGE | INTRAVENOUS | Status: DC | PRN
  Administered 2019-05-11 (×4): 10 mg via INTRAVENOUS

## 2019-05-11 MED ORDER — FENTANYL CITRATE (PF) 250 MCG/5ML IJ SOLN
INTRAMUSCULAR | Status: AC
  Filled 2019-05-11: qty 5

## 2019-05-11 MED ORDER — OXYCODONE HCL 5 MG/5ML PO SOLN: 5.0000 mg | Freq: Once | ORAL | Status: DC | PRN

## 2019-05-11 MED ORDER — ACETAMINOPHEN 325 MG PO TABS: 650.0000 mg | ORAL_TABLET | Freq: Four times a day (QID) | ORAL | Status: DC

## 2019-05-11 MED ORDER — LACTATED RINGERS IV SOLN
INTRAVENOUS | Status: DC | PRN
  Administered 2019-05-11 (×2): via INTRAVENOUS

## 2019-05-11 MED ORDER — MIDAZOLAM HCL 5 MG/5ML IJ SOLN
INTRAMUSCULAR | Status: DC | PRN
  Administered 2019-05-11: 2 mg via INTRAVENOUS

## 2019-05-11 MED ORDER — PROMETHAZINE HCL 25 MG/ML IJ SOLN: 6.2500 mg | INTRAMUSCULAR | Status: DC | PRN

## 2019-05-11 MED ORDER — MIDAZOLAM HCL 2 MG/2ML IJ SOLN
INTRAMUSCULAR | Status: AC
  Filled 2019-05-11: qty 2

## 2019-05-11 MED ORDER — PROPOFOL 10 MG/ML IV BOLUS
INTRAVENOUS | Status: DC | PRN
  Administered 2019-05-11: 200 mg via INTRAVENOUS
  Administered 2019-05-11: 50 mg via INTRAVENOUS

## 2019-05-11 MED ORDER — SUCCINYLCHOLINE CHLORIDE 200 MG/10ML IV SOSY
PREFILLED_SYRINGE | INTRAVENOUS | Status: DC | PRN
  Administered 2019-05-11: 140 mg via INTRAVENOUS

## 2019-05-11 MED ORDER — SODIUM CHLORIDE 0.9 % IR SOLN
Status: DC | PRN
  Administered 2019-05-11: 1000 mL

## 2019-05-11 MED ORDER — LIDOCAINE 2% (20 MG/ML) 5 ML SYRINGE
INTRAMUSCULAR | Status: DC | PRN
  Administered 2019-05-11: 100 mg via INTRAVENOUS

## 2019-05-11 MED ORDER — DEXAMETHASONE SODIUM PHOSPHATE 10 MG/ML IJ SOLN
INTRAMUSCULAR | Status: DC | PRN
  Administered 2019-05-11: 5 mg via INTRAVENOUS

## 2019-05-11 MED ORDER — MORPHINE SULFATE (PF) 2 MG/ML IV SOLN
1.0000 mg | INTRAVENOUS | Status: DC | PRN
  Administered 2019-05-11 – 2019-05-15 (×12): 1 mg via INTRAVENOUS
  Filled 2019-05-11 (×12): qty 1

## 2019-05-11 MED ORDER — ACETAMINOPHEN 325 MG PO TABS
650.0000 mg | ORAL_TABLET | Freq: Four times a day (QID) | ORAL | Status: DC
  Administered 2019-05-11 – 2019-05-16 (×18): 650 mg via ORAL
  Filled 2019-05-11 (×18): qty 2

## 2019-05-11 MED ORDER — PROPOFOL 10 MG/ML IV BOLUS
INTRAVENOUS | Status: AC
  Filled 2019-05-11: qty 20

## 2019-05-11 MED ORDER — EPHEDRINE 5 MG/ML INJ
INTRAVENOUS | Status: AC
  Filled 2019-05-11: qty 10

## 2019-05-11 MED ORDER — FENTANYL CITRATE (PF) 100 MCG/2ML IJ SOLN
INTRAMUSCULAR | Status: DC | PRN
  Administered 2019-05-11 (×2): 50 ug via INTRAVENOUS
  Administered 2019-05-11: 100 ug via INTRAVENOUS

## 2019-05-11 MED ORDER — HYDROMORPHONE HCL 1 MG/ML IJ SOLN: 0.2500 mg | INTRAMUSCULAR | Status: DC | PRN

## 2019-05-11 MED ORDER — SODIUM CHLORIDE 0.9 % IR SOLN
Status: DC | PRN
  Administered 2019-05-11: 9000 mL

## 2019-05-11 MED ORDER — DEXAMETHASONE SODIUM PHOSPHATE 10 MG/ML IJ SOLN
INTRAMUSCULAR | Status: AC
  Filled 2019-05-11: qty 1

## 2019-05-11 MED ORDER — ONDANSETRON HCL 4 MG/2ML IJ SOLN
INTRAMUSCULAR | Status: DC | PRN
  Administered 2019-05-11: 4 mg via INTRAVENOUS

## 2019-05-11 SURGICAL SUPPLY — 50 items
BANDAGE ACE 3X5.8 VEL STRL LF (GAUZE/BANDAGES/DRESSINGS) IMPLANT
BANDAGE ELASTIC 6 VELCRO ST LF (GAUZE/BANDAGES/DRESSINGS) ×2 IMPLANT
BNDG COHESIVE 4X5 TAN STRL (GAUZE/BANDAGES/DRESSINGS) ×4 IMPLANT
BNDG COHESIVE 6X5 TAN STRL LF (GAUZE/BANDAGES/DRESSINGS) ×8 IMPLANT
BNDG GAUZE ELAST 4 BULKY (GAUZE/BANDAGES/DRESSINGS) ×6 IMPLANT
BNDG GAUZE STRTCH 6 (GAUZE/BANDAGES/DRESSINGS) ×4 IMPLANT
COVER SURGICAL LIGHT HANDLE (MISCELLANEOUS) ×4 IMPLANT
COVER WAND RF STERILE (DRAPES) ×4 IMPLANT
CUFF TOURNIQUET SINGLE 24IN (TOURNIQUET CUFF) IMPLANT
CUFF TOURNIQUET SINGLE 34IN LL (TOURNIQUET CUFF) IMPLANT
CUFF TOURNIQUET SINGLE 44IN (TOURNIQUET CUFF) IMPLANT
DRAPE U-SHAPE 47X51 STRL (DRAPES) ×4 IMPLANT
DURAPREP 26ML APPLICATOR (WOUND CARE) ×4 IMPLANT
ELECT REM PT RETURN 9FT ADLT (ELECTROSURGICAL)
ELECTRODE REM PT RTRN 9FT ADLT (ELECTROSURGICAL) IMPLANT
GAUZE PACKING IODOFORM 1/2 (PACKING) ×2 IMPLANT
GAUZE SPONGE 4X4 12PLY STRL (GAUZE/BANDAGES/DRESSINGS) ×8 IMPLANT
GAUZE XEROFORM 5X9 LF (GAUZE/BANDAGES/DRESSINGS) ×4 IMPLANT
GLOVE BIOGEL PI IND STRL 7.0 (GLOVE) ×2 IMPLANT
GLOVE BIOGEL PI INDICATOR 7.0 (GLOVE) ×2
GLOVE ECLIPSE 7.0 STRL STRAW (GLOVE) ×4 IMPLANT
GLOVE SKINSENSE NS SZ7.5 (GLOVE) ×4
GLOVE SKINSENSE STRL SZ7.5 (GLOVE) ×4 IMPLANT
GOWN STRL REIN XL XLG (GOWN DISPOSABLE) ×8 IMPLANT
HANDPIECE INTERPULSE COAX TIP (DISPOSABLE)
KIT BASIN OR (CUSTOM PROCEDURE TRAY) ×4 IMPLANT
KIT TURNOVER KIT B (KITS) ×4 IMPLANT
MANIFOLD NEPTUNE II (INSTRUMENTS) ×4 IMPLANT
PACK ORTHO EXTREMITY (CUSTOM PROCEDURE TRAY) ×4 IMPLANT
PAD ABD 8X10 STRL (GAUZE/BANDAGES/DRESSINGS) ×4 IMPLANT
PAD ARMBOARD 7.5X6 YLW CONV (MISCELLANEOUS) ×8 IMPLANT
PADDING CAST ABS 4INX4YD NS (CAST SUPPLIES) ×2
PADDING CAST ABS COTTON 4X4 ST (CAST SUPPLIES) ×2 IMPLANT
PADDING CAST COTTON 6X4 STRL (CAST SUPPLIES) ×4 IMPLANT
SET HNDPC FAN SPRY TIP SCT (DISPOSABLE) IMPLANT
SET IRRIG Y TYPE TUR BLADDER L (SET/KITS/TRAYS/PACK) ×2 IMPLANT
SPONGE LAP 18X18 RF (DISPOSABLE) ×4 IMPLANT
STOCKINETTE IMPERVIOUS 9X36 MD (GAUZE/BANDAGES/DRESSINGS) ×4 IMPLANT
SUT ETHILON 2 0 FS 18 (SUTURE) ×4 IMPLANT
SUT ETHILON 2 0 PSLX (SUTURE) IMPLANT
SUT VIC AB 2-0 FS1 27 (SUTURE) ×8 IMPLANT
SWAB CULTURE ESWAB REG 1ML (MISCELLANEOUS) IMPLANT
TOWEL OR 17X24 6PK STRL BLUE (TOWEL DISPOSABLE) ×4 IMPLANT
TOWEL OR 17X26 10 PK STRL BLUE (TOWEL DISPOSABLE) ×4 IMPLANT
TUBE CONNECTING 12'X1/4 (SUCTIONS) ×1
TUBE CONNECTING 12X1/4 (SUCTIONS) ×3 IMPLANT
TUBING TUR DISP (UROLOGICAL SUPPLIES) ×4 IMPLANT
UNDERPAD 30X30 (UNDERPADS AND DIAPERS) ×8 IMPLANT
WATER STERILE IRR 1000ML POUR (IV SOLUTION) ×4 IMPLANT
YANKAUER SUCT BULB TIP NO VENT (SUCTIONS) ×4 IMPLANT

## 2019-05-11 NOTE — Anesthesia Procedure Notes (Signed)
Procedure Name: Intubation Date/Time: 05/11/2019 1:29 PM Performed by: Oletta Lamas, CRNA Pre-anesthesia Checklist: Patient identified, Emergency Drugs available, Suction available and Patient being monitored Patient Re-evaluated:Patient Re-evaluated prior to induction Oxygen Delivery Method: Circle System Utilized Preoxygenation: Pre-oxygenation with 100% oxygen Induction Type: IV induction Ventilation: Mask ventilation without difficulty Laryngoscope Size: Mac and 3 Grade View: Grade I Tube type: Oral Number of attempts: 1 Airway Equipment and Method: Stylet Placement Confirmation: ETT inserted through vocal cords under direct vision,  positive ETCO2 and breath sounds checked- equal and bilateral Secured at: 21 cm Tube secured with: Tape Dental Injury: Teeth and Oropharynx as per pre-operative assessment

## 2019-05-11 NOTE — Anesthesia Preprocedure Evaluation (Addendum)
Anesthesia Evaluation  Patient identified by MRN, date of birth, ID band Patient awake    Reviewed: Allergy & Precautions, NPO status , Patient's Chart, lab work & pertinent test results  History of Anesthesia Complications Negative for: history of anesthetic complications  Airway Mallampati: II  TM Distance: >3 FB Neck ROM: Full    Dental no notable dental hx.    Pulmonary neg pulmonary ROS,    Pulmonary exam normal        Cardiovascular hypertension, Normal cardiovascular exam     Neuro/Psych negative neurological ROS     GI/Hepatic negative GI ROS, Neg liver ROS,   Endo/Other  Morbid obesity  Renal/GU negative Renal ROS     Musculoskeletal Right leg abscess   Abdominal   Peds  Hematology  (+) anemia , Hgb 10.9   Anesthesia Other Findings Day of surgery medications reviewed with the patient.  Reproductive/Obstetrics                            Anesthesia Physical Anesthesia Plan  ASA: III  Anesthesia Plan: General   Post-op Pain Management:    Induction: Intravenous  PONV Risk Score and Plan: 4 or greater and Treatment Barcellos vary due to age or medical condition, Ondansetron, Dexamethasone and Midazolam  Airway Management Planned: Oral ETT  Additional Equipment:   Intra-op Plan:   Post-operative Plan: Extubation in OR  Informed Consent: I have reviewed the patients History and Physical, chart, labs and discussed the procedure including the risks, benefits and alternatives for the proposed anesthesia with the patient or authorized representative who has indicated his/her understanding and acceptance.     Dental advisory given  Plan Discussed with: CRNA  Anesthesia Plan Comments:        Anesthesia Quick Evaluation

## 2019-05-11 NOTE — Progress Notes (Signed)
[facility]  Date of Admission:  05/09/2019     Total days of antibiotics 3         ASSESSMENT/PLAN  Brianna Randall has multiple abscesses of the lower extremity and neck likely from insect bites infected from swimming in a river with cultures positive for MSSA. Initially started on vancomycin and narrowed down to Ancef. Plan is for surgical intervention today for source control.  MSSA abscess - Antibiotic therapy narrowed to Ancef. Surgical intervention today. Duration of therapy to be determined based on surgical findings. Will likely be able to be treated with 2 weeks of oral antibiotics at discharge.  Homelessness - Will request social work evaluation to aid in resources.  Peripheral Neuropathy - Neuropathy of lower extremities from previous back injury. Continue gabapentin per primary team.    Principal Problem:   Soft tissue abscess Active Problems:   Normocytic anemia   Psoriasis   History of nephrolithiasis   Obesity   Neuropathy   Hypertension   . [MAR Hold] citalopram  20 mg Oral Daily  . [MAR Hold] enoxaparin (LOVENOX) injection  40 mg Subcutaneous QHS  . [MAR Hold] gabapentin  800 mg Oral TID  . [MAR Hold] metoprolol tartrate  25 mg Oral BID    SUBJECTIVE:  Afebrile overnight and transferred from [facility] for surgical intervention. No acute events overnight. Continues to have pain in her neck and lower extremities where the abscess are located.   No Known Allergies   Review of Systems: Review of Systems  Constitutional: Negative for chills, fever and weight loss.  Respiratory: Negative for cough, shortness of breath and wheezing.   Cardiovascular: Negative for chest pain and leg swelling.  Gastrointestinal: Negative for abdominal pain, constipation, diarrhea, nausea and vomiting.  Musculoskeletal: Positive for neck pain.  Skin: Negative for rash.      OBJECTIVE: Vitals:   05/10/19 1800 05/10/19 2003 05/11/19 0643 05/11/19  0818  BP: (!) 144/83 (!) 154/92 (!) 145/86 (!) 138/91  Pulse: 90 95 88 87  Resp: Temp: 99.9 F (37.7 C) 99.5 F (37.5 C) 98.4 F (36.9 C) 98.7 F (37.1 C)  TempSrc: Oral Oral Oral Oral  SpO2: 97% 99% 98% 98%  Weight:      Height:       Body mass index is 42.98 kg/m.  Physical Exam Constitutional:      General: She is not in acute distress.    Appearance: She is well-developed.     Comments: Lying in bed with head of bed elevated; pleasant.   Neck:     Comments: Abscess noted on the right side of the neck.  Cardiovascular:     Rate and Rhythm: Normal rate and regular rhythm.     Heart sounds: Normal heart sounds.  Pulmonary:     Effort: Pulmonary effort is normal.     Breath sounds: Normal breath sounds.  Musculoskeletal:     Comments: Lower extremity wrapped in dressing is clean and dry.   Skin:    General: Skin is warm and dry.  Neurological:     Mental Status: She is alert and oriented to person, place, and time.  Psychiatric:        Behavior: Behavior normal.        Thought Content: Thought content normal.        Judgment: Judgment normal.     Lab Results Lab Results  Component Value Date   WBC 8.3  05/10/2019   HGB 10.9 (L) 05/10/2019   HCT 33.5 (L) 05/10/2019   MCV 94.9 05/10/2019   PLT 300 05/10/2019    Lab Results  Component Value Date   CREATININE 0.61 05/10/2019   BUN 7 05/10/2019   NA 138 05/10/2019   K 3.6 05/10/2019   CL 107 05/10/2019   CO2 25 05/10/2019    Lab Results  Component Value Date   ALT 36 09/11/2017   AST 41 09/11/2017   ALKPHOS 144 (H) 09/11/2017   BILITOT 0.5 09/11/2017     Microbiology: Recent Results (from the past 240 hour(s))  Blood culture (routine x 2)     Status: None (Preliminary result)   Collection Time: 05/09/19  7:26 PM  Result Value Ref Range Status   Specimen Description   Final    BLOOD LEFT ANTECUBITAL Performed at [facility], [address] [place], [place] [address]     Special Requests   Final    BOTTLES DRAWN AEROBIC AND ANAEROBIC Blood Culture results Azzarello not be optimal due to an excessive volume of blood received in culture bottles Performed at [facility], [address], [place], [place] [address]    Culture   Final    NO GROWTH 2 DAYS Performed at [facility], [address] [place], [place] [address]    Report Status PENDING  Incomplete  Blood culture (routine x 2)     Status: None (Preliminary result)   Collection Time: 05/09/19  7:26 PM  Result Value Ref Range Status   Specimen Description   Final    BLOOD RIGHT ANTECUBITAL Performed at [facility], [address] [place], [place] [address]    Special Requests   Final    BOTTLES DRAWN AEROBIC AND ANAEROBIC Blood Culture adequate volume Performed at [facility], [address], [place], [place] [address]    Culture   Final    NO GROWTH 2 DAYS Performed at [facility], [address], [place], [place] [address]    Report Status PENDING  Incomplete  SARS Coronavirus 2 (CEPHEID - Performed in [facility] hospital lab), Hosp Order     Status: None   Collection Time: 05/09/19  7:26 PM  Result Value Ref Range Status   SARS Coronavirus 2 NEGATIVE NEGATIVE Final    Comment: (NOTE) If result is NEGATIVE SARS-CoV-2 target nucleic acids are NOT DETECTED. The SARS-CoV-2 RNA is generally detectable in upper and lower  respiratory specimens during the acute phase of infection. The lowest  concentration of SARS-CoV-2 viral copies this assay can detect is 250  copies / mL. A negative result does not preclude SARS-CoV-2 infection  and should not be used as the sole basis for treatment or other  patient management decisions.  A negative result Choinski occur with  improper specimen collection / handling, submission of specimen other  than nasopharyngeal swab, presence of viral mutation(s) within the  areas targeted by this  assay, and inadequate number of viral copies  (<250 copies / mL). A negative result must be combined with clinical  observations, patient history, and epidemiological information. If result is POSITIVE SARS-CoV-2 target nucleic acids are DETECTED. The SARS-CoV-2 RNA is generally detectable in upper and lower  respiratory specimens dur ing the acute phase of infection.  Positive  results are indicative of active infection with SARS-CoV-2.  Clinical  correlation with patient history and other diagnostic information is  necessary to  determine patient infection status.  Positive results do  not rule out bacterial infection or co-infection with other viruses. If result is PRESUMPTIVE POSTIVE SARS-CoV-2 nucleic acids Ivie BE PRESENT.   A presumptive positive result was obtained on the submitted specimen  and confirmed on repeat testing.  While 2019 novel coronavirus  (SARS-CoV-2) nucleic acids Myint be present in the submitted sample  additional confirmatory testing Budhu be necessary for epidemiological  and / or clinical management purposes  to differentiate between  SARS-CoV-2 and other Sarbecovirus currently known to infect humans.  If clinically indicated additional testing with an alternate test  methodology [phone]) is advised. The SARS-CoV-2 RNA is generally  detectable in upper and lower respiratory sp ecimens during the acute  phase of infection. The expected result is Negative. Fact Sheet for Patients:  BoilerBrush.com.cy Fact Sheet for Healthcare Providers: https://pope.com/ This test is not yet approved or cleared by the [place] FDA and has been authorized for detection and/or diagnosis of SARS-CoV-2 by FDA under an Emergency Use Authorization (EUA).  This EUA will remain in effect (meaning this test can be used) for the duration of the COVID-19 declaration under Section 564(b)(1) of the Act, 21 U.S.C. section 360bbb-3(b)(1),  unless the authorization is terminated or revoked sooner. Performed at [facility], [address], [place], [place] [address]   Wound or Superficial Culture     Status: None (Preliminary result)   Collection Time: 05/09/19  8:52 PM  Result Value Ref Range Status   Specimen Description   Final    WOUND RIGHT LEG Performed at [facility], [address], [place], [place] [address]    Special Requests   Final    Normal Performed at [facility], [address] [place], [place] [address]    Gram Stain   Final    FEW WBC PRESENT,BOTH PMN AND MONONUCLEAR FEW GRAM POSITIVE COCCI Performed at [facility], [address], [place], [place] [address]    Culture MODERATE STAPHYLOCOCCUS AUREUS  Final   Report Status PENDING  Incomplete   Organism ID, Bacteria STAPHYLOCOCCUS AUREUS  Final      Susceptibility   Staphylococcus aureus - MIC*    CIPROFLOXACIN >=8 RESISTANT Resistant     ERYTHROMYCIN >=8 RESISTANT Resistant     GENTAMICIN <=0.5 SENSITIVE Sensitive     OXACILLIN 0.5 SENSITIVE Sensitive     TETRACYCLINE <=1 SENSITIVE Sensitive     VANCOMYCIN 1 SENSITIVE Sensitive     TRIMETH/SULFA <=10 SENSITIVE Sensitive     CLINDAMYCIN <=0.25 SENSITIVE Sensitive     RIFAMPIN <=0.5 SENSITIVE Sensitive     Inducible Clindamycin NEGATIVE Sensitive     * MODERATE STAPHYLOCOCCUS AUREUS  Aerobic/Anaerobic Culture (surgical/deep wound)     Status: None (Preliminary result)   Collection Time: 05/09/19  8:52 PM  Result Value Ref Range Status   Specimen Description   Final    ABSCESS RIGHT LEG Performed at [facility], [address], [place], [place] [address]    Special Requests   Final    Normal Performed at [facility], [address], [place], [place] [address]    Gram Stain   Final    ABUNDANT WBC PRESENT, PREDOMINANTLY PMN MODERATE GRAM POSITIVE COCCI Performed at [facility], [address] [place], [place] [address]    Culture MODERATE STAPHYLOCOCCUS AUREUS  Final   Report Status PENDING  Incomplete   Organism ID, Bacteria  STAPHYLOCOCCUS AUREUS  Final      Susceptibility   Staphylococcus aureus - MIC*    CIPROFLOXACIN >=8 RESISTANT Resistant     ERYTHROMYCIN >=8 RESISTANT Resistant     GENTAMICIN <=0.5 SENSITIVE Sensitive     OXACILLIN 0.5 SENSITIVE Sensitive     TETRACYCLINE <=1 SENSITIVE Sensitive     VANCOMYCIN 1 SENSITIVE Sensitive     TRIMETH/SULFA <=10 SENSITIVE Sensitive     CLINDAMYCIN <=0.25 SENSITIVE Sensitive     RIFAMPIN <=0.5 SENSITIVE Sensitive     Inducible Clindamycin NEGATIVE Sensitive     * MODERATE STAPHYLOCOCCUS AUREUS  MRSA PCR Screening     Status: None   Collection Time: 05/10/19 11:35 PM  Result Value Ref Range Status   MRSA by PCR NEGATIVE NEGATIVE Final    Comment:        The GeneXpert MRSA Assay (FDA approved for NASAL specimens only), is one component of a comprehensive MRSA colonization surveillance program. It is not intended to diagnose MRSA infection nor to guide or monitor treatment for MRSA infections. Performed at [facility], [address] [place], [place] [id number]      Marcos EkeGreg , NP [facility] [phone] Pager  05/11/2019  2:35 PM

## 2019-05-11 NOTE — H&P (Signed)
H&P update  The surgical history has been reviewed and remains accurate without interval change.  The patient was re-examined and patient's physiologic condition has not changed significantly in the last 30 days. The condition still exists that makes this procedure necessary. The treatment plan remains the same, without new options for care.  No new pharmacological allergies or types of therapy has been initiated that would change the plan or the appropriateness of the plan.  The patient and/or family understand the potential benefits and risks.  Mayra Reel, MD 05/11/2019 9:32 AM

## 2019-05-11 NOTE — Op Note (Signed)
05/11/2019  2:50 PM  PATIENT:  Denny PeonKim M Hisaw  48 y.o. female  PRE-OPERATIVE DIAGNOSIS:  Cellulitis and abscess of right lower extremity and neck  POST-OPERATIVE DIAGNOSIS:  Cellulitis and abscess of right lower extremity and neck  PROCEDURE:  Procedure(s): EXCISIONAL DEBRIDEMENT RIGHT POSTERIOR NECK ABSCESS WITH IRRIGATION  SURGEON:  Surgeon(s): Gaynelle AduWilson, Lorrie Gargan, MD  ASSISTANTS: none   ANESTHESIA:   general  DRAINS: none   LOCAL MEDICATIONS USED:  NONE  SPECIMEN:  Source of Specimen:  AEROBIC/ANAEROBIC CULTURES  DISPOSITION OF SPECIMEN:  MICRO  COUNTS:  YES  INDICATION FOR PROCEDURE: 48 year old female who is homeless presented with abscesses to her right lower leg and right posterior neck.  Apparently she had been bitten by ants.  Orthopedics and general surgery were consulted for the leg and neck respectively.  She had a CT scan of her neck which did not demonstrate any overt abscess but there is significant stranding and inflammation in the soft tissue of the right posterior neck.  The patient had an obvious wound with necrotic skin edges and nonviable soft tissue so I recommended further debridement in the operating room while she was in surgery with orthopedics.  Please see chart for additional details  PROCEDURE: The patient was taken to the operating room by orthopedics for her right leg.  General endotracheal anesthesia was established.  She was then operating room 5 at [facility].  Please see Dr. Warren DanesXu's operative note regarding his portion of the procedure.  Once he was done with his work on the right leg I came into the operating room.  The patient's neck was turned slightly to the right.  This exposed the current wound in the right posterior neck just at the base of the hairline.  She had open wound of approximately 2 x 2 cm with irregular skin edges and nonviable skin edges and nonviable soft tissue.  They have area of induration along with the open wound measured 6 cm x 4  cm. the area was prepped with Betadine.  A surgical timeout was performed.  She was on scheduled broad-spectrum antibiotics.  Using a scalpel I excisionally debrided the skin and immediate subcutaneous tissue.  There was necrotic soft tissue.  There is some pus in this area.  Cultures were obtained.  Using a curette I cleaned up the base and the sides of the wound.  This was done to healthy tissue was encountered.  There was viable tissue encountered.  At this point the cavity was irrigated.  Hemostasis was achieved with electrocautery.  The cavity was packed with half-inch packing strip followed by fluffs and a Kerlix.  All counts were correct x2 there were no immediate complications.  She was taken to the recovery room in stable condition  Excisional debridement:    Tool used for debridement (curette, scapel, etc.) scalpel and curette    Frequency of surgical debridement.   Initial    Measurement of total devitalized tissue (wound surface) before and after surgical debridement.   Wound measured 2 x 2 cm at the beginning; at the conclusion 3 x 3 cm x 2 cm deep   Area and depth of devitalized tissue removed from wound.  1 x 1 x 1.5 cm  .  Blood loss and description of tissue removed.  20 cc, skin, soft tissue  Evidence of the progress of the wound's response to treatment.  A.  Current wound volume (current dimensions and depth).  3 x 3 x 2 cm  B.  Presence (and extent of) of infection.  Skin and soft tissue  C.  Presence (and extent of) of non viable tissue.  1 x 1 x 1-1/2  D.  Other material in the wound that is expected to inhibit healing.  None  8.  Was there any viable tissue removed (measurements): No   PLAN OF CARE: Admit to inpatient   PATIENT DISPOSITION:  PACU - hemodynamically stable.   Delay start of Pharmacological VTE agent (>24hrs) due to surgical blood loss or risk of bleeding:  no  Mary Sella. Andrey Campanile, MD, FACS General, Bariatric, & Minimally Invasive Surgery [facility], [place]

## 2019-05-11 NOTE — Progress Notes (Signed)
PROGRESS NOTE    Brianna Randall  QIH:[id number] DOB: February 28, 1971 DOA: 05/09/2019 PCP: Brianna Sharps, NP  Brief Narrative:  Brianna Randall is a 48 y.o. female with a known history of peripheral neuropathy, kidney stones, homelessness presents to the emergency department for evaluation of an insect bite.  Patient was in a usual state of health until 6 days ago she noticed that she had several insect bites after swimming in a river.  The bites were on her right lower leg and right lateral neck.  She developed redness and swelling within the last 24 hours, not associated with any fevers, chills. Patient denies fevers/chills, weakness, dizziness, chest pain, shortness of breath, N/V/C/D, abdominal pain, dysuria/frequency, changes in mental status. Otherwise there has been no change in status. Patient has been taking medication as prescribed and there has been no recent change in medication or diet.  No recent antibiotics.  There has been no recent illness, hospitalizations, travel or sick contacts. In ED patient received Levaquin. Medical admission has been requested for further management of right lower extremity cellulitis with abscess, right neck cellulitis. General surgery and infectious diseases were consulted for further evaluation recommendations.  General surgery is obtaining a CT of the soft tissue of the neck.  General surgery also recommended orthopedic evaluation for her right lower leg and I spoke with Dr. Roda Shutters who will see the patient in consultation but requested patient be transferred to [facility] for incision and drainage in OR.  Assessment & Plan:   Principal Problem:   Soft tissue abscess Active Problems:   Normocytic anemia   Psoriasis   History of nephrolithiasis   Obesity   Neuropathy   Hypertension  Purulent cellulitis of the right lateral neck and right lower leg -Admitted Inpatient given severity and need for likely prolonged IV Abx -Continue Vancomycin only per ID; Previously on IV  Zosyn as well -Pain control with po Tramadol 50 mg q6hprn Moderate Pain and IV Morphine 1 mg q4hprn Severe Pain  -Follow-up cultures -Pending I/D vs debridement in OR today with Gen Sx - patient was NPO at midnight  Mild Hypokalemia, resolved -follow am labs  History of Neuropathy/unspecified psychiatric Hx -Continue Gabapentin 800 mg po TID and with Citalopram 20 mg po Daily   History of Hypertension -Continue Metoprolol 25 mg po BID  Normocytic Anemia, likely anemia of chronic disease/iron deficiency -No signs/symptoms of bleeding -Follow am CBC -Patient admits to poor nutrition  Morbid Obesity -Estimated body mass index is 42.98 kg/m as calculated from the following:   Height as of this encounter:  (1.575 m).   Weight as of this encounter: 106.6 kg. -Weight loss and Dietary Counseling given   DVT prophylaxis: Enoxaparin 40 mg sq qHS Code Status: FULL CODE Family Communication: No family present at bedside  Disposition Plan: Transfer to [facility] for further I and D and workup   Consultants:  Infectious Diseases General Surgery Orthopedic Surgery     Antimicrobials: Anti-infectives (From admission, onward)   Start     Dose/Rate Route Frequency Ordered Stop   05/11/19 1400  [MAR Hold]  ceFAZolin (ANCEF) IVPB 2g/100 mL premix     (MAR Hold since Thu 05/11/2019 at 1200. Reason: Transfer to a Procedural area.)   2 g 200 mL/hr over 30 Minutes Intravenous Every 8 hours 05/11/19 1107     05/10/19 2000  vancomycin (VANCOCIN) 1,500 mg in sodium chloride 0.9 % 500 mL IVPB  Status:  Discontinued     1,500  mg 250 mL/hr over 120 Minutes Intravenous Every 24 hours 05/10/19 0403 05/11/19 1107   05/10/19 0600  piperacillin-tazobactam (ZOSYN) IVPB 3.375 g  Status:  Discontinued     3.375 g 12.5 mL/hr over 240 Minutes Intravenous Every 8 hours 05/09/19 2227 05/10/19 1537   05/09/19 2245  vancomycin (VANCOCIN) IVPB 1000 mg/200 mL premix  Status:  Discontinued     1,000 mg 200  mL/hr over 60 Minutes Intravenous  Once 05/09/19 2242 05/09/19 2249   05/09/19 2230  piperacillin-tazobactam (ZOSYN) IVPB 3.375 g     3.375 g 100 mL/hr over 30 Minutes Intravenous  Once 05/09/19 2227 05/10/19 0118   05/09/19 1945  vancomycin (VANCOCIN) 2,000 mg in sodium chloride 0.9 % 500 mL IVPB     2,000 mg 250 mL/hr over 120 Minutes Intravenous  Once 05/09/19 1937 05/09/19 2158   05/09/19 1930  levofloxacin (LEVAQUIN) IVPB 750 mg     750 mg 100 mL/hr over 90 Minutes Intravenous  Once 05/09/19 1926 05/10/19 0036     Subjective: And examined at bedside was still having some pain but denies any chest pain, lightheadedness or dizziness.  No nausea or vomiting.  Thinks her leg is looking slightly better but still draining.  Her main complaint is the pain in the right side of her neck.  I discussed with her about transferring to [facility] she is understandable agreeable with plan  Objective: Vitals:   05/11/19 0818 05/11/19 1503 05/11/19 1506 05/11/19 1512  BP: (!) 138/91 136/84 136/84 130/86  Pulse: 87 (!) 105 (!) 101 99  Resp: Temp: 98.7 F (37.1 C) 98.5 F (36.9 C)    TempSrc: Oral     SpO2: 98% 100% 96% 93%  Weight:      Height:        Intake/Output Summary (Last 24 hours) at 05/11/2019 1513 Last data filed at 05/11/2019 1458 Gross per 24 hour  Intake 1000 ml  Output 75 ml  Net 925 ml   Filed Weights   05/09/19 1845  Weight: 106.6 kg   Examination: Physical Exam:  Constitutional: WN/WD morbidly obese Caucasian female in NAD and appears calm but slightly uncomfortable Eyes: Lids and conjunctivae normal, sclerae anicteric  ENMT: External Ears, Nose appear normal. Grossly normal hearing. Mucous membranes are moist.  Neck: Appears normal, supple, no cervical masses, normal ROM, no appreciable thyromegaly; Has a cellulitic area with a Neck Abscess R posterolateral region Respiratory: Diminished to auscultation bilaterally, no wheezing, rales, rhonchi or  crackles. Normal respiratory effort and patient is not tachypenic. No accessory muscle use.  Cardiovascular: RRR, no murmurs / rubs / gallops. S1 and S2 auscultated. 1+ LE extremity edema.  Abdomen: Soft, non-tender, Distended due to body habitus. No masses palpated. No appreciable hepatosplenomegaly. Bowel sounds positive x4.  GU: Deferred. Musculoskeletal: No clubbing / cyanosis of digits/nails. No joint deformity upper and lower extremities.  Skin: Right Neck and Right Leg Abscess and Cellulitis draining  - bandages wet, nonbloody Neurologic: CN 2-12 grossly intact with no focal deficits. Romberg sign and cerebellar reflexes not assessed.  Psychiatric: Normal judgment and insight. Alert and oriented x 3. Normal mood and appropriate affect.   Data Reviewed: I have personally reviewed following labs and imaging studies  CBC: Recent Labs  Lab 05/09/19 1926 05/10/19 0420  WBC 7.6 8.3  NEUTROABS 5.9  --   HGB 11.0* 10.9*  HCT 34.9* 33.5*  MCV 93.6 94.9  PLT 332 300   Basic Metabolic Panel:  Recent Labs  Lab 05/09/19 1926 05/10/19 0420  NA 140 138  K 3.1* 3.6  CL 108 107  CO2 26 25  GLUCOSE 92 95  BUN 10 7  CREATININE 0.71 0.61  CALCIUM 8.6* 8.2*  MG  --  1.8  PHOS  --  3.6   GFR: Estimated Creatinine Clearance: 97.6 mL/min (by C-G formula based on SCr of 0.61 mg/dL). Liver Function Tests: No results for input(s): AST, ALT, ALKPHOS, BILITOT, PROT, ALBUMIN in the last 168 hours. No results for input(s): LIPASE, AMYLASE in the last 168 hours. No results for input(s): AMMONIA in the last 168 hours. Coagulation Profile: No results for input(s): INR, PROTIME in the last 168 hours. Cardiac Enzymes: No results for input(s): CKTOTAL, CKMB, CKMBINDEX, TROPONINI in the last 168 hours. BNP (last 3 results) No results for input(s): PROBNP in the last 8760 hours. HbA1C: No results for input(s): HGBA1C in the last 72 hours. CBG: No results for input(s): GLUCAP in the last 168  hours. Lipid Profile: No results for input(s): CHOL, HDL, LDLCALC, TRIG, CHOLHDL, LDLDIRECT in the last 72 hours. Thyroid Function Tests: No results for input(s): TSH, T4TOTAL, FREET4, T3FREE, THYROIDAB in the last 72 hours. Anemia Panel: No results for input(s): VITAMINB12, FOLATE, FERRITIN, TIBC, IRON, RETICCTPCT in the last 72 hours. Sepsis Labs: Recent Labs  Lab 05/09/19 1926  LATICACIDVEN 0.6    Recent Results (from the past 240 hour(s))  Blood culture (routine x 2)     Status: None (Preliminary result)   Collection Time: 05/09/19  7:26 PM  Result Value Ref Range Status   Specimen Description   Final    BLOOD LEFT ANTECUBITAL Performed at [facility], [address] [place], [place] [address]    Special Requests   Final    BOTTLES DRAWN AEROBIC AND ANAEROBIC Blood Culture results Pedraza not be optimal due to an excessive volume of blood received in culture bottles Performed at [facility], [address] [place], [place] [address]    Culture   Final    NO GROWTH 2 DAYS Performed at [facility], [address] [place], [place] [address]    Report Status PENDING  Incomplete  Blood culture (routine x 2)     Status: None (Preliminary result)   Collection Time: 05/09/19  7:26 PM  Result Value Ref Range Status   Specimen Description   Final    BLOOD RIGHT ANTECUBITAL Performed at [facility], [address] [place], [place] [address]    Special Requests   Final    BOTTLES DRAWN AEROBIC AND ANAEROBIC Blood Culture adequate volume Performed at [facility], [address] [place], [place] [address]    Culture   Final    NO GROWTH 2 DAYS Performed at [facility], [address] [place], [place] [address]    Report Status PENDING  Incomplete  SARS Coronavirus 2 (CEPHEID - Performed in [facility] hospital lab), Hosp Order     Status: None   Collection Time: 05/09/19  7:26 PM    Result Value Ref Range Status   SARS Coronavirus 2 NEGATIVE NEGATIVE Final    Comment: (NOTE) If result is NEGATIVE SARS-CoV-2 target nucleic acids are NOT DETECTED. The SARS-CoV-2 RNA is generally detectable in upper and lower  respiratory specimens during the acute phase of infection. The lowest  concentration of SARS-CoV-2 viral copies this assay can detect is 250  copies / mL.  A negative result does not preclude SARS-CoV-2 infection  and should not be used as the sole basis for treatment or other  patient management decisions.  A negative result Aker occur with  improper specimen collection / handling, submission of specimen other  than nasopharyngeal swab, presence of viral mutation(s) within the  areas targeted by this assay, and inadequate number of viral copies  (<250 copies / mL). A negative result must be combined with clinical  observations, patient history, and epidemiological information. If result is POSITIVE SARS-CoV-2 target nucleic acids are DETECTED. The SARS-CoV-2 RNA is generally detectable in upper and lower  respiratory specimens dur ing the acute phase of infection.  Positive  results are indicative of active infection with SARS-CoV-2.  Clinical  correlation with patient history and other diagnostic information is  necessary to determine patient infection status.  Positive results do  not rule out bacterial infection or co-infection with other viruses. If result is PRESUMPTIVE POSTIVE SARS-CoV-2 nucleic acids [place] BE PRESENT.   A presumptive positive result was obtained on the submitted specimen  and confirmed on repeat testing.  While 2019 novel coronavirus  (SARS-CoV-2) nucleic acids Kirk be present in the submitted sample  additional confirmatory testing Farve be necessary for epidemiological  and / or clinical management purposes  to differentiate between  SARS-CoV-2 and other Sarbecovirus currently known to infect humans.  If clinically indicated additional  testing with an alternate test  methodology [phone]) is advised. The SARS-CoV-2 RNA is generally  detectable in upper and lower respiratory sp ecimens during the acute  phase of infection. The expected result is Negative. Fact Sheet for Patients:  BoilerBrush.com.cy Fact Sheet for Healthcare Providers: https://pope.com/ This test is not yet approved or cleared by the [place] FDA and has been authorized for detection and/or diagnosis of SARS-CoV-2 by FDA under an Emergency Use Authorization (EUA).  This EUA will remain in effect (meaning this test can be used) for the duration of the COVID-19 declaration under Section 564(b)(1) of the Act, 21 U.S.C. section 360bbb-3(b)(1), unless the authorization is terminated or revoked sooner. Performed at [facility], [address], [place], [place] [address]   Wound or Superficial Culture     Status: None (Preliminary result)   Collection Time: 05/09/19  8:52 PM  Result Value Ref Range Status   Specimen Description   Final    WOUND RIGHT LEG Performed at [facility], [address] [place], [place] [address]    Special Requests   Final    Normal Performed at [facility], [address], [place], [place] [address]    Gram Stain   Final    FEW WBC PRESENT,BOTH PMN AND MONONUCLEAR FEW GRAM POSITIVE COCCI Performed at [facility], [address] [place], [place] [address]    Culture MODERATE STAPHYLOCOCCUS AUREUS  Final   Report Status PENDING  Incomplete   Organism ID, Bacteria STAPHYLOCOCCUS AUREUS  Final      Susceptibility   Staphylococcus aureus - MIC*    CIPROFLOXACIN >=8 RESISTANT Resistant     ERYTHROMYCIN >=8 RESISTANT Resistant     GENTAMICIN <=0.5 SENSITIVE Sensitive     OXACILLIN 0.5 SENSITIVE Sensitive     TETRACYCLINE <=1 SENSITIVE Sensitive     VANCOMYCIN 1 SENSITIVE Sensitive     TRIMETH/SULFA <=10  SENSITIVE Sensitive     CLINDAMYCIN <=0.25 SENSITIVE Sensitive     RIFAMPIN <=0.5 SENSITIVE Sensitive     Inducible Clindamycin NEGATIVE  Sensitive     * MODERATE STAPHYLOCOCCUS AUREUS  Aerobic/Anaerobic Culture (surgical/deep wound)     Status: None (Preliminary result)   Collection Time: 05/09/19  8:52 PM  Result Value Ref Range Status   Specimen Description   Final    ABSCESS RIGHT LEG Performed at [facility], [address], [place], [place] [address]    Special Requests   Final    Normal Performed at [facility], [address], [place], [place] [address]    Gram Stain   Final    ABUNDANT WBC PRESENT, PREDOMINANTLY PMN MODERATE GRAM POSITIVE COCCI Performed at [facility], [address], [place], [place] [address]    Culture MODERATE STAPHYLOCOCCUS AUREUS  Final   Report Status PENDING  Incomplete   Organism ID, Bacteria STAPHYLOCOCCUS AUREUS  Final      Susceptibility   Staphylococcus aureus - MIC*    CIPROFLOXACIN >=8 RESISTANT Resistant     ERYTHROMYCIN >=8 RESISTANT Resistant     GENTAMICIN <=0.5 SENSITIVE Sensitive     OXACILLIN 0.5 SENSITIVE Sensitive     TETRACYCLINE <=1 SENSITIVE Sensitive     VANCOMYCIN 1 SENSITIVE Sensitive     TRIMETH/SULFA <=10 SENSITIVE Sensitive     CLINDAMYCIN <=0.25 SENSITIVE Sensitive     RIFAMPIN <=0.5 SENSITIVE Sensitive     Inducible Clindamycin NEGATIVE Sensitive     * MODERATE STAPHYLOCOCCUS AUREUS  MRSA PCR Screening     Status: None   Collection Time: 05/10/19 11:35 PM  Result Value Ref Range Status   MRSA by PCR NEGATIVE NEGATIVE Final    Comment:        The GeneXpert MRSA Assay (FDA approved for NASAL specimens only), is one component of a comprehensive MRSA colonization surveillance program. It is not intended to diagnose MRSA infection nor to guide or monitor treatment for MRSA infections. Performed at [facility], [address], [place], [place] [address]       Radiology Studies: Dg Tibia/fibula Right  Result Date: 05/09/2019 CLINICAL DATA:  Pain status post bite. EXAM: RIGHT TIBIA AND FIBULA - 2 VIEW COMPARISON:  None. FINDINGS: There is nonspecific soft tissue swelling about the right lower extremity. There is no displaced fracture. No dislocation. No radiopaque foreign body. Skin thickening is noted in the proximal pretibial region. IMPRESSION: 1. No acute osseous abnormality. 2. Nonspecific soft tissue swelling about the lower extremity. Clinical correlation is recommended. There is some skin thickening involving the pretibial region at the level of the proximal tibia. Findings can be seen with cellulitis. Electronically Signed   By: Katherine Mantle M.D.   On: 05/09/2019 19:54   Ct Soft Tissue Neck Wo Contrast  Result Date: 05/10/2019 CLINICAL DATA:  Purulent cellulitis of the right lateral neck and right lower leg after being bitten by ants and then swimming in a river per the patient. Patient having drainage from the neck abscess, bloody and golden tinged fluid. EXAM: CT NECK WITHOUT CONTRAST TECHNIQUE: Multidetector CT imaging of the neck was performed following the standard protocol without intravenous contrast. COMPARISON:  None. FINDINGS: Pharynx and larynx: Normal. No mass or swelling. Salivary glands: No inflammation, mass, or stone. Thyroid: Normal. Lymph nodes: Reactive level 2 and level 5 lymph nodes on the RIGHT. Vascular: Cannot assess for patency or thrombus. Limited intracranial: Negative Visualized orbits: Negative Mastoids and visualized paranasal sinuses: Negative Skeleton: Negative Upper chest: Negative Other: Extensive cellulitis affects the posterior triangle of the neck. There is skin  thickening, with a phlegmon or abscess, 21 x 19 mm, deep to the area superficial drainage. Beyond this, there is extensive stranding of the subcutaneous and deep fat, and the RIGHT posterior nuchal musculature and sternocleidomastoid are enlarged,  suggesting myositis IMPRESSION: Extensive cellulitis affecting the RIGHT posterior triangle of the neck, with phlegmon or abscess, 21 x 19 mm deep to the area superficial drainage. Extensive stranding of the subcutaneous and deep fat, and enlargement of the RIGHT posterior nuchal musculature and sternocleidomastoid suggesting myositis. CT of the neck with contrast is recommended for further evaluation to evaluate for presence or absence of abscesses, as well as potential complication internal jugular vein thrombosis. Electronically Signed   By: Elsie Stain M.D.   On: 05/10/2019 13:39   Ct Soft Tissue Neck W Contrast  Result Date: 05/10/2019 CLINICAL DATA:  Right neck abscess. EXAM: CT NECK WITH CONTRAST TECHNIQUE: Multidetector CT imaging of the neck was performed using the standard protocol following the bolus administration of intravenous contrast. CONTRAST:  75mL OMNIPAQUE IOHEXOL 300 MG/ML  SOLN COMPARISON:  CT neck without contrast 05/10/2019 FINDINGS: PHARYNX AND LARYNX: --Nasopharynx: Fossae of Rosenmuller are clear. Normal adenoid tonsils for age. --Oral cavity and oropharynx: The palatine and lingual tonsils are normal. The visible oral cavity and floor of mouth are normal. --Hypopharynx: Normal vallecula and pyriform sinuses. --Larynx: Normal epiglottis and pre-epiglottic space. Normal aryepiglottic and vocal folds. --Retropharyngeal space: No abscess, effusion or lymphadenopathy. SALIVARY GLANDS: --Parotid: No mass lesion or inflammation. No sialolithiasis or ductal dilatation. --Submandibular: Symmetric without inflammation. No sialolithiasis or ductal dilatation. --Sublingual: Normal. No ranula or other visible lesion of the base of tongue and floor of mouth. THYROID: Normal. LYMPH NODES: Right level 5A nodes measure up to 11 mm. Right level 3 nodes measure up to 10 mm. VASCULAR: No internal jugular vein thrombosis. Major vessels are normal. LIMITED INTRACRANIAL: Normal. VISUALIZED ORBITS: Normal.  MASTOIDS AND VISUALIZED PARANASAL SINUSES: No fluid levels or advanced mucosal thickening. No mastoid effusion. SKELETON: No bony spinal canal stenosis. No lytic or blastic lesions. UPPER CHEST: Clear. OTHER: Cutaneous/subcutaneous phlegmon of the right posterior triangle with edema of the adjacent posterior neck muscles and right sternocleidomastoid. No discrete fluid collection. Moderate surrounding skin thickening. IMPRESSION: 1. Redemonstration of cutaneous/subcutaneous phlegmon of the right neck posterior triangle without drainable fluid collection. 2. Edema of the right sternocleidomastoid muscle and right posterior neck musculature suggestive of myositis. 3. No internal jugular vein thrombosis. 4. Reactive right level 3 and 5A lymphadenopathy. Electronically Signed   By: Deatra Robinson M.D.   On: 05/10/2019 22:00   Scheduled Meds:  [MAR Hold] citalopram  20 mg Oral Daily   [MAR Hold] enoxaparin (LOVENOX) injection  40 mg Subcutaneous QHS   [MAR Hold] gabapentin  800 mg Oral TID   [MAR Hold] metoprolol tartrate  25 mg Oral BID   Continuous Infusions:  acetaminophen     [MAR Hold]  ceFAZolin (ANCEF) IV      LOS: 2 days   Azucena Fallen, DO [facility] PAGER is on AMION  If 7PM-7AM, please contact night-coverage [email or web address] Password [email or web address] 05/11/2019, 3:13 PM

## 2019-05-11 NOTE — Anesthesia Postprocedure Evaluation (Signed)
Anesthesia Post Note  Patient: Brianna Randall  Procedure(s) Performed: IRRIGATION AND DEBRIDEMENT LEG AND NECK ABSCESS (Right Leg Lower) Irrigation And Debridement Wound (Neck)     Patient location during evaluation: PACU Anesthesia Type: General Level of consciousness: awake and alert Pain management: pain level controlled Vital Signs Assessment: post-procedure vital signs reviewed and stable Respiratory status: spontaneous breathing, nonlabored ventilation and respiratory function stable Cardiovascular status: blood pressure returned to baseline and stable Postop Assessment: no apparent nausea or vomiting Anesthetic complications: no          Kaylyn Layer

## 2019-05-11 NOTE — Transfer of Care (Signed)
Immediate Anesthesia Transfer of Care Note  Patient: Brianna Randall  Procedure(s) Performed: IRRIGATION AND DEBRIDEMENT LEG AND NECK ABSCESS (Right Leg Lower) Irrigation And Debridement Wound (Neck)  Patient Location: PACU  Anesthesia Type:General  Level of Consciousness: awake and patient cooperative  Airway & Oxygen Therapy: Patient Spontanous Breathing and Patient connected to nasal cannula oxygen  Post-op Assessment: Report given to RN, Post -op Vital signs reviewed and stable and Patient moving all extremities  Post vital signs: Reviewed and stable  Last Vitals:  Vitals Value Taken Time  BP 136/84 05/11/2019  3:03 PM  Temp    Pulse 103 05/11/2019  3:04 PM  Resp 15 05/11/2019  3:04 PM  SpO2 100 % 05/11/2019  3:04 PM  Vitals shown include unvalidated device data.  Last Pain:  Vitals:   05/11/19 0818  TempSrc: Oral  PainSc: 2       Patients Stated Pain Goal: 1 (05/10/19 2247)  Complications: No apparent anesthesia complications

## 2019-05-11 NOTE — Plan of Care (Signed)

## 2019-05-11 NOTE — Progress Notes (Signed)
[facility]/Trauma Progress Note      Assessment/Plan Homeless Hypertension  Lower extremity neuropathy  Abscess right posterior neck - recommend I&D today, will discuss timing with ortho  Abscess right lower leg - OR today with Dr. Roda ShuttersXu for I&D  FEN: NPO ID: Zosyn 06/02-06/03 Vancomycin 6/2>> DVT: lovenox Follow up:  TBD POC:  Pt is homeless and Spouse lives in a tent.  Plan:  culture of lower leg show staph aureus, ID following and recommending only vanc. OR today for I&D of neck and leg     LOS: 2 days    Subjective: CC: neck and R leg pain  Pain about the same. No issues overnight.   Objective: Vital signs in last 24 hours: Temp:  [98.2 F (36.8 C)-99.9 F (37.7 C)] 98.7 F (37.1 C) (06/04 0818) Pulse Rate:  [87-96] 87 (06/04 0818) Resp:  [14-18] 14 (06/04 0818) BP: (138-154)/(83-96) 138/91 (06/04 0818) SpO2:  [97 %-99 %] 98 % (06/04 0818) Last BM Date: 05/09/19  Intake/Output from previous day: 06/03 0701 - 06/04 0700 In: 480 [P.O.:480] Out: -  Intake/Output this shift: No intake/output data recorded.  PE: Gen:  Alert, NAD, pleasant, cooperative Neck: see photo below, large area of induration and erythema with scant purulent drainage and very TTP Pulm:  Rate and effort normal Skin: warm and dry      Anti-infectives: Anti-infectives (From admission, onward)   Start     Dose/Rate Route Frequency Ordered Stop   05/10/19 2000  vancomycin (VANCOCIN) 1,500 mg in sodium chloride 0.9 % 500 mL IVPB     1,500 mg 250 mL/hr over 120 Minutes Intravenous Every 24 hours 05/10/19 0403     05/10/19 0600  piperacillin-tazobactam (ZOSYN) IVPB 3.375 g  Status:  Discontinued     3.375 g 12.5 mL/hr over 240 Minutes Intravenous Every 8 hours 05/09/19 2227 05/10/19 1537   05/09/19 2245  vancomycin (VANCOCIN) IVPB 1000 mg/200 mL premix  Status:  Discontinued     1,000 mg 200 mL/hr over 60 Minutes Intravenous  Once 05/09/19 2242 05/09/19 2249   05/09/19 2230  piperacillin-tazobactam (ZOSYN) IVPB 3.375 g     3.375 g 100 mL/hr over 30 Minutes Intravenous  Once 05/09/19 2227 05/10/19 0118   05/09/19 1945  vancomycin (VANCOCIN) 2,000 mg in sodium chloride 0.9 % 500 mL IVPB     2,000 mg 250 mL/hr over 120 Minutes Intravenous  Once 05/09/19 1937 05/09/19 2158   05/09/19 1930  levofloxacin (LEVAQUIN) IVPB 750 mg     750 mg 100 mL/hr over 90 Minutes Intravenous  Once 05/09/19 1926 05/10/19 0036      Lab Results:  Recent Labs    05/09/19 1926 05/10/19 0420  WBC 7.6 8.3  HGB 11.0* 10.9*  HCT 34.9* 33.5*  PLT 332 300   BMET Recent Labs    05/09/19 1926 05/10/19 0420  NA 140 138  K 3.1* 3.6  CL 108 107  CO2 26 25  GLUCOSE 92 95  BUN 10 7  CREATININE 0.71 0.61  CALCIUM 8.6* 8.2*   PT/INR No results for input(s): LABPROT, INR in the last 72 hours. CMP     Component Value Date/Time   NA 138 05/10/2019 0420   NA 139 02/06/2013 0449   K 3.6 05/10/2019 0420   K 3.1 (L) 02/06/2013 0449   CL 107 05/10/2019 0420   CL 107 02/06/2013 0449   CO2 25 05/10/2019 0420   CO2 27 02/06/2013 0449   GLUCOSE 95 05/10/2019  0420   GLUCOSE 91 02/06/2013 0449   BUN 7 05/10/2019 0420   BUN 9 02/06/2013 0449   CREATININE 0.61 05/10/2019 0420   CREATININE 0.67 02/06/2013 0449   CALCIUM 8.2 (L) 05/10/2019 0420   CALCIUM 8.2 (L) 02/06/2013 0449   PROT 7.4 09/11/2017 2313   PROT 7.2 08/04/2012 0030   ALBUMIN 3.8 09/11/2017 2313   ALBUMIN 3.7 08/04/2012 0030   AST 41 09/11/2017 2313   AST 25 08/04/2012 0030   ALT 36 09/11/2017 2313   ALT 21 08/04/2012 0030   ALKPHOS 144 (H) 09/11/2017 2313   ALKPHOS 65 08/04/2012 0030   BILITOT 0.5 09/11/2017 2313   BILITOT 0.4 08/04/2012 0030   GFRNONAA >60 05/10/2019 0420   GFRNONAA >60 02/06/2013 0449   GFRAA >60 05/10/2019 0420   GFRAA >60 02/06/2013 0449   Lipase     Component Value Date/Time   LIPASE 55 (L) 08/04/2012 0030    Studies/Results: Dg Tibia/fibula Right  Result Date:  05/09/2019 CLINICAL DATA:  Pain status post bite. EXAM: RIGHT TIBIA AND FIBULA - 2 VIEW COMPARISON:  None. FINDINGS: There is nonspecific soft tissue swelling about the right lower extremity. There is no displaced fracture. No dislocation. No radiopaque foreign body. Skin thickening is noted in the proximal pretibial region. IMPRESSION: 1. No acute osseous abnormality. 2. Nonspecific soft tissue swelling about the lower extremity. Clinical correlation is recommended. There is some skin thickening involving the pretibial region at the level of the proximal tibia. Findings can be seen with cellulitis. Electronically Signed   By: Katherine Mantle M.D.   On: 05/09/2019 19:54   Ct Soft Tissue Neck Wo Contrast  Result Date: 05/10/2019 CLINICAL DATA:  Purulent cellulitis of the right lateral neck and right lower leg after being bitten by ants and then swimming in a river per the patient. Patient having drainage from the neck abscess, bloody and golden tinged fluid. EXAM: CT NECK WITHOUT CONTRAST TECHNIQUE: Multidetector CT imaging of the neck was performed following the standard protocol without intravenous contrast. COMPARISON:  None. FINDINGS: Pharynx and larynx: Normal. No mass or swelling. Salivary glands: No inflammation, mass, or stone. Thyroid: Normal. Lymph nodes: Reactive level 2 and level 5 lymph nodes on the RIGHT. Vascular: Cannot assess for patency or thrombus. Limited intracranial: Negative Visualized orbits: Negative Mastoids and visualized paranasal sinuses: Negative Skeleton: Negative Upper chest: Negative Other: Extensive cellulitis affects the posterior triangle of the neck. There is skin thickening, with a phlegmon or abscess, 21 x 19 mm, deep to the area superficial drainage. Beyond this, there is extensive stranding of the subcutaneous and deep fat, and the RIGHT posterior nuchal musculature and sternocleidomastoid are enlarged, suggesting myositis IMPRESSION: Extensive cellulitis affecting the  RIGHT posterior triangle of the neck, with phlegmon or abscess, 21 x 19 mm deep to the area superficial drainage. Extensive stranding of the subcutaneous and deep fat, and enlargement of the RIGHT posterior nuchal musculature and sternocleidomastoid suggesting myositis. CT of the neck with contrast is recommended for further evaluation to evaluate for presence or absence of abscesses, as well as potential complication internal jugular vein thrombosis. Electronically Signed   By: Elsie Stain M.D.   On: 05/10/2019 13:39   Ct Soft Tissue Neck W Contrast  Result Date: 05/10/2019 CLINICAL DATA:  Right neck abscess. EXAM: CT NECK WITH CONTRAST TECHNIQUE: Multidetector CT imaging of the neck was performed using the standard protocol following the bolus administration of intravenous contrast. CONTRAST:  66mL OMNIPAQUE IOHEXOL 300 MG/ML  SOLN  COMPARISON:  CT neck without contrast 05/10/2019 FINDINGS: PHARYNX AND LARYNX: --Nasopharynx: Fossae of Rosenmuller are clear. Normal adenoid tonsils for age. --Oral cavity and oropharynx: The palatine and lingual tonsils are normal. The visible oral cavity and floor of mouth are normal. --Hypopharynx: Normal vallecula and pyriform sinuses. --Larynx: Normal epiglottis and pre-epiglottic space. Normal aryepiglottic and vocal folds. --Retropharyngeal space: No abscess, effusion or lymphadenopathy. SALIVARY GLANDS: --Parotid: No mass lesion or inflammation. No sialolithiasis or ductal dilatation. --Submandibular: Symmetric without inflammation. No sialolithiasis or ductal dilatation. --Sublingual: Normal. No ranula or other visible lesion of the base of tongue and floor of mouth. THYROID: Normal. LYMPH NODES: Right level 5A nodes measure up to 11 mm. Right level 3 nodes measure up to 10 mm. VASCULAR: No internal jugular vein thrombosis. Major vessels are normal. LIMITED INTRACRANIAL: Normal. VISUALIZED ORBITS: Normal. MASTOIDS AND VISUALIZED PARANASAL SINUSES: No fluid levels or  advanced mucosal thickening. No mastoid effusion. SKELETON: No bony spinal canal stenosis. No lytic or blastic lesions. UPPER CHEST: Clear. OTHER: Cutaneous/subcutaneous phlegmon of the right posterior triangle with edema of the adjacent posterior neck muscles and right sternocleidomastoid. No discrete fluid collection. Moderate surrounding skin thickening. IMPRESSION: 1. Redemonstration of cutaneous/subcutaneous phlegmon of the right neck posterior triangle without drainable fluid collection. 2. Edema of the right sternocleidomastoid muscle and right posterior neck musculature suggestive of myositis. 3. No internal jugular vein thrombosis. 4. Reactive right level 3 and 5A lymphadenopathy. Electronically Signed   By: Deatra Robinson M.D.   On: 05/10/2019 22:00      Jerre Simon , [facility] 05/11/2019, 9:36 AM  Pager: [phone] Mon-Wed, Friday 7:00am-4:30pm Thurs 7am-11:30am  Consults: [phone]

## 2019-05-11 NOTE — Op Note (Addendum)
   Date of Surgery: 05/11/2019  INDICATIONS: Brianna Randall is a 48 y.o.-year-old female with a right lower leg abscesses;  The patient did consent to the procedure after discussion of the risks and benefits.  PREOPERATIVE DIAGNOSIS: Right lower leg abscesses x 2  POSTOPERATIVE DIAGNOSIS: Same.  PROCEDURE: Irrigation and debridement of 2 separate right lower leg abscesses, deep  SURGEON: N. Glee Arvin, M.D.  ASSIST: Starlyn Skeans [place], [place]; necessary for the timely completion of procedure and due to complexity of procedure.  ANESTHESIA:  general  IV FLUIDS AND URINE: See anesthesia.  ESTIMATED BLOOD LOSS: 50 mL.  IMPLANTS: none  DRAINS: wet to dry packing  COMPLICATIONS: see description of procedure.  DESCRIPTION OF PROCEDURE: The patient was brought to the operating room and placed supine on the operating table.  The patient had been signed prior to the procedure and this was documented. The patient had the anesthesia placed by the anesthesiologist.  A time-out was performed to confirm that this was the correct patient, site, side and location. The patient did receive antibiotics prior to the incision and was re-dosed during the procedure as needed at indicated intervals.  A tourniquet was placed.  The patient had the operative extremity prepped and draped in the standard surgical fashion.    A #15 blade was used to sharply excise the circular necrotic tissue directly overlying the abscesses.  There were 2 separate abscesses on the lower leg.  Once incision was made there was return of frank pus which was cultured.  We then continued sharp excisional debridement of the skin, subcutaneous tissue, muscle with a rondure.  This was done circumferentially.  Fortunately I did not find any evidence of involvement of the tibia.  I performed sharp excisional debridement of approximately 85 cm.  Debridement was carried back to bleeding and viable tissue.  The abscesses were then irrigated with 9 L of  normal saline.  Hemostasis was then obtained.  Given the skin defect we made the decision to pack the wound with wet-to-dry gauze.  Sterile dressings were applied.  Patient tolerated procedure well and had no immediate complications.  POSTOPERATIVE PLAN: Patient will need to undergo wet-to-dry dressings twice a day to the wounds on the floor.  She will need to return back to the operating room for repeat I&D on Monday.  Brianna Reel, MD [facility] [phone] 2:16 PM

## 2019-05-12 ENCOUNTER — Encounter (HOSPITAL_COMMUNITY): Payer: Self-pay | Admitting: Orthopaedic Surgery

## 2019-05-12 LAB — CBC
HCT: 31.4 % — ABNORMAL LOW (ref 36.0–46.0)
Hemoglobin: 10.5 g/dL — ABNORMAL LOW (ref 12.0–15.0)
MCH: 30.1 pg (ref 26.0–34.0)
MCHC: 33.4 g/dL (ref 30.0–36.0)
MCV: 90 fL (ref 80.0–100.0)
Platelets: 319 10*3/uL (ref 150–400)
RBC: 3.49 MIL/uL — ABNORMAL LOW (ref 3.87–5.11)
RDW: 12.8 % (ref 11.5–15.5)
WBC: 7.3 10*3/uL (ref 4.0–10.5)
nRBC: 0 % (ref 0.0–0.2)

## 2019-05-12 LAB — AEROBIC CULTURE W GRAM STAIN (SUPERFICIAL SPECIMEN): Special Requests: NORMAL

## 2019-05-12 LAB — BASIC METABOLIC PANEL
Anion gap: 10 (ref 5–15)
BUN: 7 mg/dL (ref 6–20)
CO2: 25 mmol/L (ref 22–32)
Calcium: 8.5 mg/dL — ABNORMAL LOW (ref 8.9–10.3)
Chloride: 105 mmol/L (ref 98–111)
Creatinine, Ser: 0.59 mg/dL (ref 0.44–1.00)
GFR calc Af Amer: >60 mL/min (ref >60)
GFR calc non Af Amer: >60 mL/min (ref >60)
Glucose, Bld: 120 mg/dL — ABNORMAL HIGH (ref 70–99)
Potassium: 3.4 mmol/L — ABNORMAL LOW (ref 3.5–5.1)
Sodium: 140 mmol/L (ref 135–145)

## 2019-05-12 MED ORDER — SODIUM CHLORIDE 0.9% FLUSH
10.0000 mL | INTRAVENOUS | Status: DC | PRN
  Administered 2019-05-16: 10 mL
  Filled 2019-05-12: qty 40

## 2019-05-12 MED ORDER — SODIUM CHLORIDE 0.9% FLUSH
10.0000 mL | Freq: Two times a day (BID) | INTRAVENOUS | Status: DC
  Administered 2019-05-12 – 2019-05-14 (×3): 10 mL

## 2019-05-12 NOTE — Progress Notes (Signed)
[facility]/Tra[facility] Progress Note  1 Day Post-Op   Assessment/Plan Homeless Hypertension  Lower extremity neuropathy  Abscess right posterior neck - S/P excisional debridement R posterior neck abscess with irrigation, Dr. Andrey Campanile, 06/04 - BID wet to dry dressing changes, no further surgery indicated at this time - cultures pending - we will see again on Monday, please page [place] with any emergent needs over the weekend  Abscess right lower leg - S/P irrigation and debridement of 2 R lower let abscesses, Dr. Roda Shutters, 06/04 - plan to return to OR Monday  FEN: reg diet MI:WOEHO 06/02-06/03 Vancomycin 6/2-06/04 Ancef 06/04>>   WBC WNL DVT: lovenox Follow up: TBD POC: Pt is homeless and Spouse lives in a tent.  Plan: culture of lower leg show staph aureus, ID following. Wet to dry of neck wound BID.      LOS: 3 days    Subjective: CC: RLE and neck wound pain  No issues overnight. Neck pain improved from yesterday.   Objective: Vital signs in last 24 hours: Temp:  [97.8 F (36.6 C)-98.7 F (37.1 C)] 98.5 F (36.9 C) (06/05 0923) Pulse Rate:  [70-105] 84 (06/05 0923) Resp:  [10-18] 16 (06/05 0923) BP: (125-148)/(83-92) 125/83 (06/05 0922) SpO2:  [92 %-100 %] 100 % (06/05 0923) Last BM Date: 05/09/19  Intake/Output from previous day: 06/04 0701 - 06/05 0700 In: 1220 [P.O.:120; I.V.:1000; IV Piggyback:100] Out: 75 [Blood:75] Intake/Output this shift: Total I/O In: 240 [P.O.:240] Out: -   PE: Gen:  Alert, NAD, pleasant, cooperative Neck: see photo below, wound looks clean, no purulent drainage, improved erythema. Repacked.  Pulm:  Rate and effort normal Skin: warm and dry      Anti-infectives: Anti-infectives (From admission, onward)   Start     Dose/Rate Route Frequency Ordered Stop   05/11/19 1400  ceFAZolin (ANCEF) IVPB 2g/100 mL premix     2 g 200 mL/hr over 30 Minutes Intravenous Every 8 hours 05/11/19 1107     05/10/19 2000  vancomycin  (VANCOCIN) 1,500 mg in sodium chloride 0.9 % 500 mL IVPB  Status:  Discontinued     1,500 mg 250 mL/hr over 120 Minutes Intravenous Every 24 hours 05/10/19 0403 05/11/19 1107   05/10/19 0600  piperacillin-tazobactam (ZOSYN) IVPB 3.375 g  Status:  Discontinued     3.375 g 12.5 mL/hr over 240 Minutes Intravenous Every 8 hours 05/09/19 2227 05/10/19 1537   05/09/19 2245  vancomycin (VANCOCIN) IVPB 1000 mg/200 mL premix  Status:  Discontinued     1,000 mg 200 mL/hr over 60 Minutes Intravenous  Once 05/09/19 2242 05/09/19 2249   05/09/19 2230  piperacillin-tazobactam (ZOSYN) IVPB 3.375 g     3.375 g 100 mL/hr over 30 Minutes Intravenous  Once 05/09/19 2227 05/10/19 0118   05/09/19 1945  vancomycin (VANCOCIN) 2,000 mg in sodium chloride 0.9 % 500 mL IVPB     2,000 mg 250 mL/hr over 120 Minutes Intravenous  Once 05/09/19 1937 05/09/19 2158   05/09/19 1930  levofloxacin (LEVAQUIN) IVPB 750 mg     750 mg 100 mL/hr over 90 Minutes Intravenous  Once 05/09/19 1926 05/10/19 0036      Lab Results:  Recent Labs    05/10/19 0420 05/12/19 0124  WBC 8.3 7.3  HGB 10.9* 10.5*  HCT 33.5* 31.4*  PLT 300 319   BMET Recent Labs    05/10/19 0420 05/12/19 0124  NA 138 140  K 3.6 3.4*  CL 107 105  CO2 25 25  GLUCOSE 95 120*  BUN 7 7  CREATININE 0.61 0.59  CALCIUM 8.2* 8.5*   PT/INR No results for input(s): LABPROT, INR in the last 72 hours. CMP     Component Value Date/Time   NA 140 05/12/2019 0124   NA 139 02/06/2013 0449   K 3.4 (L) 05/12/2019 0124   K 3.1 (L) 02/06/2013 0449   CL 105 05/12/2019 0124   CL 107 02/06/2013 0449   CO2 25 05/12/2019 0124   CO2 27 02/06/2013 0449   GLUCOSE 120 (H) 05/12/2019 0124   GLUCOSE 91 02/06/2013 0449   BUN 7 05/12/2019 0124   BUN 9 02/06/2013 0449   CREATININE 0.59 05/12/2019 0124   CREATININE 0.67 02/06/2013 0449   CALCIUM 8.5 (L) 05/12/2019 0124   CALCIUM 8.2 (L) 02/06/2013 0449   PROT 7.4 09/11/2017 2313   PROT 7.2 08/04/2012 0030    ALBUMIN 3.8 09/11/2017 2313   ALBUMIN 3.7 08/04/2012 0030   AST 41 09/11/2017 2313   AST 25 08/04/2012 0030   ALT 36 09/11/2017 2313   ALT 21 08/04/2012 0030   ALKPHOS 144 (H) 09/11/2017 2313   ALKPHOS 65 08/04/2012 0030   BILITOT 0.5 09/11/2017 2313   BILITOT 0.4 08/04/2012 0030   GFRNONAA >60 05/12/2019 0124   GFRNONAA >60 02/06/2013 0449   GFRAA >60 05/12/2019 0124   GFRAA >60 02/06/2013 0449   Lipase     Component Value Date/Time   LIPASE 55 (L) 08/04/2012 0030    Studies/Results: Ct Soft Tissue Neck Wo Contrast  Result Date: 05/10/2019 CLINICAL DATA:  Purulent cellulitis of the right lateral neck and right lower leg after being bitten by ants and then swimming in a river per the patient. Patient having drainage from the neck abscess, bloody and golden tinged fluid. EXAM: CT NECK WITHOUT CONTRAST TECHNIQUE: Multidetector CT imaging of the neck was performed following the standard protocol without intravenous contrast. COMPARISON:  None. FINDINGS: Pharynx and larynx: Normal. No mass or swelling. Salivary glands: No inflammation, mass, or stone. Thyroid: Normal. Lymph nodes: Reactive level 2 and level 5 lymph nodes on the RIGHT. Vascular: Cannot assess for patency or thrombus. Limited intracranial: Negative Visualized orbits: Negative Mastoids and visualized paranasal sinuses: Negative Skeleton: Negative Upper chest: Negative Other: Extensive cellulitis affects the posterior triangle of the neck. There is skin thickening, with a phlegmon or abscess, 21 x 19 mm, deep to the area superficial drainage. Beyond this, there is extensive stranding of the subcutaneous and deep fat, and the RIGHT posterior nuchal musculature and sternocleidomastoid are enlarged, suggesting myositis IMPRESSION: Extensive cellulitis affecting the RIGHT posterior triangle of the neck, with phlegmon or abscess, 21 x 19 mm deep to the area superficial drainage. Extensive stranding of the subcutaneous and deep fat, and  enlargement of the RIGHT posterior nuchal musculature and sternocleidomastoid suggesting myositis. CT of the neck with contrast is recommended for further evaluation to evaluate for presence or absence of abscesses, as well as potential complication internal jugular vein thrombosis. Electronically Signed   By: Elsie Stain M.D.   On: 05/10/2019 13:39   Ct Soft Tissue Neck W Contrast  Result Date: 05/10/2019 CLINICAL DATA:  Right neck abscess. EXAM: CT NECK WITH CONTRAST TECHNIQUE: Multidetector CT imaging of the neck was performed using the standard protocol following the bolus administration of intravenous contrast. CONTRAST:  75mL OMNIPAQUE IOHEXOL 300 MG/ML  SOLN COMPARISON:  CT neck without contrast 05/10/2019 FINDINGS: PHARYNX AND LARYNX: --Nasopharynx: Fossae of Rosenmuller are clear. Normal adenoid tonsils for  age. --Oral cavity and oropharynx: The palatine and lingual tonsils are normal. The visible oral cavity and floor of mouth are normal. --Hypopharynx: Normal vallecula and pyriform sinuses. --Larynx: Normal epiglottis and pre-epiglottic space. Normal aryepiglottic and vocal folds. --Retropharyngeal space: No abscess, effusion or lymphadenopathy. SALIVARY GLANDS: --Parotid: No mass lesion or inflammation. No sialolithiasis or ductal dilatation. --Submandibular: Symmetric without inflammation. No sialolithiasis or ductal dilatation. --Sublingual: Normal. No ranula or other visible lesion of the base of tongue and floor of mouth. THYROID: Normal. LYMPH NODES: Right level 5A nodes measure up to 11 mm. Right level 3 nodes measure up to 10 mm. VASCULAR: No internal jugular vein thrombosis. Major vessels are normal. LIMITED INTRACRANIAL: Normal. VISUALIZED ORBITS: Normal. MASTOIDS AND VISUALIZED PARANASAL SINUSES: No fluid levels or advanced mucosal thickening. No mastoid effusion. SKELETON: No bony spinal canal stenosis. No lytic or blastic lesions. UPPER CHEST: Clear. OTHER: Cutaneous/subcutaneous  phlegmon of the right posterior triangle with edema of the adjacent posterior neck muscles and right sternocleidomastoid. No discrete fluid collection. Moderate surrounding skin thickening. IMPRESSION: 1. Redemonstration of cutaneous/subcutaneous phlegmon of the right neck posterior triangle without drainable fluid collection. 2. Edema of the right sternocleidomastoid muscle and right posterior neck musculature suggestive of myositis. 3. No internal jugular vein thrombosis. 4. Reactive right level 3 and 5A lymphadenopathy. Electronically Signed   By: Deatra RobinsonKevin  Herman M.D.   On: 05/10/2019 22:00      Jerre SimonJessica L Casmira Cramer , [facility] 05/12/2019, 10:09 AM  Pager: [phone] Mon-Wed, Friday 7:00am-4:30pm Thurs 7am-11:30am  Consults: [phone]

## 2019-05-12 NOTE — Progress Notes (Signed)
PROGRESS NOTE    Brianna Randall  ZOX:[id number] DOB: December 17, 1970 DOA: 05/09/2019 PCP: Lavinia Sharps, NP  Brief Narrative:  Brianna Randall is a 48 y.o. female with a known history of peripheral neuropathy, kidney stones, homelessness presents to the emergency department for evaluation of an insect bite.  Patient was in a usual state of health until 6 days ago she noticed that she had several insect bites after swimming in a river.  The bites were on her right lower leg and right lateral neck.  She developed redness and swelling within the last 24 hours, not associated with any fevers, chills. Patient denies fevers/chills, weakness, dizziness, chest pain, shortness of breath, N/V/C/D, abdominal pain, dysuria/frequency, changes in mental status. Otherwise there has been no change in status. Patient has been taking medication as prescribed and there has been no recent change in medication or diet.  No recent antibiotics.  There has been no recent illness, hospitalizations, travel or sick contacts. In ED patient received Levaquin. Medical admission has been requested for further management of right lower extremity cellulitis with abscess, right neck cellulitis. General surgery and infectious diseases were consulted for further evaluation recommendations.  General surgery is obtaining a CT of the soft tissue of the neck.  General surgery also recommended orthopedic evaluation for her right lower leg and I spoke with Dr. Roda Shutters who will see the patient in consultation but requested patient be transferred to [facility] for incision and drainage in OR.  Assessment & Plan:   Principal Problem:   Soft tissue abscess Active Problems:   Normocytic anemia   Psoriasis   History of nephrolithiasis   Obesity   Neuropathy   Hypertension   Cellulitis and abscess of right lower extremity  Purulent cellulitis of the right lateral neck and right lower leg;  -likely staph aureus per preliminary report -Patient does not meet  sepsis criteria - no fevers/leukocytosis overnight -Admitted Inpatient given severity and need for likely prolonged IV Abx -Continue cefazolin only per ID; Previously on IV Zosyn/vancomycin as well -Pain control with po Tramadol 50 mg q6hprn Moderate Pain and IV Morphine 1 mg q4hprn Severe Pain  -Follow-up cultures - prelim report Staph aureus on RLE culture -S/P I/D 05/11/19 - needs repeat debridement 05/15/19 - will need to be NPO /03/20 2000  vancomycin (VANCOCIN) 1,500 mg in sodium chloride 0.9 % 500 mL IVPB  Status:  Discontinued     1,500 mg 250 mL/hr over 120 Minutes Intravenous Every 24 hours 05/10/19 0403 05/11/19 1107   05/10/19 0600  piperacillin-tazobactam (ZOSYN) IVPB  3.375 g  Status:  Discontinued     3.375 g 12.5 mL/hr over 240 Minutes Intravenous Every 8 hours 05/09/19 2227 05/10/19 1537   05/09/19 2245  vancomycin (VANCOCIN) IVPB 1000 mg/200 mL premix  Status:  Discontinued     1,000 mg 200 mL/hr over 60 Minutes Intravenous  Once 05/09/19 2242 05/09/19 2249   05/09/19 2230  piperacillin-tazobactam (ZOSYN) IVPB 3.375 g     3.375 g 100 mL/hr over 30 Minutes Intravenous  Once 05/09/19 2227 05/10/19 0118   05/09/19 1945  vancomycin (VANCOCIN) 2,000 mg in sodium chloride 0.9 % 500 mL IVPB     2,000 mg 250 mL/hr over 120 Minutes Intravenous  Once 05/09/19 1937 05/09/19 2158   05/09/19 1930  levofloxacin (LEVAQUIN) IVPB 750 mg     750 mg 100 mL/hr over 90 Minutes Intravenous  Once 05/09/19 1926 05/10/19 0036     Subjective: No acute issues or events overnight, patient tolerated procedure quite well, denies any current pain; breath, chest pain, nausea, vomiting, diarrhea, constipation, headache, fevers, chills.  Objective: Vitals:   05/12/19 0348 05/12/19 0922 05/12/19 0923 05/12/19 1554  BP: 136/84 125/83  134/86  Pulse: 70 82 84 75  Resp: 15  16 20   Temp: 97.8 F (36.6 C)  98.5 F (36.9 C) 98.2 F (36.8 C)  TempSrc: Oral  Oral Oral  SpO2: 100%  100% 98%  Weight:      Height:        Intake/Output Summary (Last 24 hours) at 05/12/2019 1652 Last data filed at 05/12/2019 0900 Gross per 24 hour  Intake 360 ml  Output --  Net 360 ml   Filed Weights   05/09/19 1845  Weight: 106.6 kg   Examination: Physical Exam:  Constitutional: WN/WD morbidly obese Caucasian female in NAD and appears calm but slightly uncomfortable Eyes: Lids and conjunctivae normal, sclerae anicteric  ENMT: External Ears, Nose appear normal. Grossly normal hearing. Mucous membranes are moist.  Neck: Posterolateral bandage clean dry intact Respiratory: Diminished to auscultation bilaterally, no wheezing, rales, rhonchi or crackles. Normal respiratory effort and patient is  not tachypenic. No accessory muscle use.  Cardiovascular: RRR, no murmurs / rubs / gallops. S1 and S2 auscultated. 1+ LE extremity edema.  Abdomen: Soft, non-tender, Distended due to body habitus. No masses palpated. No appreciable hepatosplenomegaly. Bowel sounds positive x4.  GU: Deferred. Musculoskeletal: No clubbing / cyanosis of digits/nails. No joint deformity upper and lower extremities.  Skin: Right Neck and Right Leg bandages clean, dry, intact Neurologic: CN 2-12 grossly intact with no focal deficits. Romberg sign and cerebellar reflexes not assessed.  Psychiatric: Normal judgment and insight. Alert and oriented x 3. Normal mood and appropriate affect.   Data Reviewed: I have personally reviewed following labs and imaging studies  CBC: Recent Labs  Lab 05/09/19 1926 05/10/19 0420 05/12/19 0124  WBC 7.6 8.3 7.3  NEUTROABS 5.9  --   --   HGB 11.0* 10.9* 10.5*  HCT 34.9* 33.5* 31.4*  MCV 93.6 94.9 90.0  PLT 332 300 319   Basic Metabolic Panel: Recent Labs  Lab 05/09/19 1926 05/10/19 0420 05/12/19 0124  NA 140 138 140  K 3.1* 3.6 3.4*  CL 108 107 105  CO2 26 25 25  GLUCOSE 92 95 120*  BUN CREATININE 0.71 0.61 0.59  CALCIUM 8.6* 8.2* 8.5*  MG  --  1.8  --   PHOS  --  3.6  --    GFR: Estimated Creatinine Clearance: 97.6 mL/min (by C-G formula based on SCr of 0.59 mg/dL). Liver Function Tests: No results for input(s): AST, ALT, ALKPHOS, BILITOT, PROT, ALBUMIN in the last 168 hours. No results for input(s): LIPASE, AMYLASE in the last 168 hours. No results for input(s): AMMONIA in the last 168 hours. Coagulation Profile: No results for input(s): INR, PROTIME in the last 168 hours. Cardiac Enzymes: No results for input(s): CKTOTAL, CKMB, CKMBINDEX, TROPONINI in the last 168 hours. BNP (last 3 results) No results for input(s): PROBNP in the last 8760 hours. HbA1C: No results for input(s): HGBA1C in the last 72 hours. CBG: No results for input(s): GLUCAP  in the last 168 hours. Lipid Profile: No results for input(s): CHOL, HDL, LDLCALC, TRIG, CHOLHDL, LDLDIRECT in the last 72 hours. Thyroid Function Tests: No results for input(s): TSH, T4TOTAL, FREET4, T3FREE, THYROIDAB in the last 72 hours. Anemia Panel: No results for input(s): VITAMINB12, FOLATE, FERRITIN, TIBC, IRON, RETICCTPCT in the last 72 hours. Sepsis Labs: Recent Labs  Lab 05/09/19 1926  LATICACIDVEN 0.6    Recent Results (from the past 240 hour(s))  Blood culture (routine x 2)     Status: None (Preliminary result)   Collection Time: 05/09/19  7:26 PM  Result Value Ref Range Status   Specimen Description   Final    BLOOD LEFT ANTECUBITAL Performed at [facility], [address] [place], [place] [address]    Special Requests   Final    BOTTLES DRAWN AEROBIC AND ANAEROBIC Blood Culture results Nafziger not be optimal due to an excessive volume of blood received in culture bottles Performed at [facility], [address], [place], [place] [address]    Culture   Final    NO GROWTH 3 DAYS Performed at [facility], [address] [place], [place] [address]    Report Status PENDING  Incomplete  Blood culture (routine x 2)     Status: None (Preliminary result)   Collection Time: 05/09/19  7:26 PM  Result Value Ref Range Status   Specimen Description   Final    BLOOD RIGHT ANTECUBITAL Performed at [facility], [address], [place], [place] [address]    Special Requests   Final    BOTTLES DRAWN AEROBIC AND ANAEROBIC Blood Culture adequate volume Performed at [facility], [address], [place], [place] [address]    Culture   Final    NO GROWTH 3 DAYS Performed at [facility], [address], [place], [place] [address]    Report Status PENDING  Incomplete  SARS Coronavirus 2 (CEPHEID - Performed in [facility] hospital lab), Hosp Order     Status: None   Collection Time:  05/09/19  7:26 PM  Result Value Ref Range Status   SARS Coronavirus 2 NEGATIVE NEGATIVE Final    Comment: (NOTE) If result is NEGATIVE SARS-CoV-2 target nucleic acids are NOT DETECTED. The SARS-CoV-2 RNA is generally detectable in upper and lower  respiratory specimens during the acute phase of infection. The lowest  concentration of SARS-CoV-2 viral copies this assay can detect is 250  copies / mL. A negative result does not preclude SARS-CoV-2 infection  and should not be used as the  sole basis for treatment or other  patient management decisions.  A negative result Kemppainen occur with  improper specimen collection / handling, submission of specimen other  than nasopharyngeal swab, presence of viral mutation(s) within the  areas targeted by this assay, and inadequate number of viral copies  (<250 copies / mL). A negative result must be combined with clinical  observations, patient history, and epidemiological information. If result is POSITIVE SARS-CoV-2 target nucleic acids are DETECTED. The SARS-CoV-2 RNA is generally detectable in upper and lower  respiratory specimens dur ing the acute phase of infection.  Positive  results are indicative of active infection with SARS-CoV-2.  Clinical  correlation with patient history and other diagnostic information is  necessary to determine patient infection status.  Positive results do  not rule out bacterial infection or co-infection with other viruses. If result is PRESUMPTIVE POSTIVE SARS-CoV-2 nucleic acids Ruminski BE PRESENT.   A presumptive positive result was obtained on the submitted specimen  and confirmed on repeat testing.  While 2019 novel coronavirus  (SARS-CoV-2) nucleic acids Wyke be present in the submitted sample  additional confirmatory testing Germer be necessary for epidemiological  and / or clinical management purposes  to differentiate between  SARS-CoV-2 and other Sarbecovirus currently known to infect humans.  If clinically  indicated additional testing with an alternate test  methodology [phone]) is advised. The SARS-CoV-2 RNA is generally  detectable in upper and lower respiratory sp ecimens during the acute  phase of infection. The expected result is Negative. Fact Sheet for Patients:  BoilerBrush.com.cy Fact Sheet for Healthcare Providers: https://pope.com/ This test is not yet approved or cleared by the [place] FDA and has been authorized for detection and/or diagnosis of SARS-CoV-2 by FDA under an Emergency Use Authorization (EUA).  This EUA will remain in effect (meaning this test can be used) for the duration of the COVID-19 declaration under Section 564(b)(1) of the Act, 21 U.S.C. section 360bbb-3(b)(1), unless the authorization is terminated or revoked sooner. Performed at [facility], [address], [place], [place] [address]   Wound or Superficial Culture     Status: None   Collection Time: 05/09/19  8:52 PM  Result Value Ref Range Status   Specimen Description   Final    WOUND RIGHT LEG Performed at [facility], [address], [place], [place] [address]    Special Requests   Final    Normal Performed at [facility], [address] [place], [place] [address]    Gram Stain   Final    FEW WBC PRESENT,BOTH PMN AND MONONUCLEAR FEW GRAM POSITIVE COCCI Performed at [facility], [address], [place], [place] [address]    Culture MODERATE STAPHYLOCOCCUS AUREUS  Final   Report Status 05/12/2019 FINAL  Final   Organism ID, Bacteria STAPHYLOCOCCUS AUREUS  Final      Susceptibility   Staphylococcus aureus - MIC*    CIPROFLOXACIN >=8 RESISTANT Resistant     ERYTHROMYCIN >=8 RESISTANT Resistant     GENTAMICIN <=0.5 SENSITIVE Sensitive     OXACILLIN 0.5 SENSITIVE Sensitive     TETRACYCLINE <=1 SENSITIVE Sensitive     VANCOMYCIN 1 SENSITIVE Sensitive     TRIMETH/SULFA  <=10 SENSITIVE Sensitive     CLINDAMYCIN <=0.25 SENSITIVE Sensitive     RIFAMPIN <=0.5 SENSITIVE Sensitive     Inducible Clindamycin NEGATIVE Sensitive     * MODERATE STAPHYLOCOCCUS AUREUS  Aerobic/Anaerobic Culture (surgical/deep wound)  Status: None (Preliminary result)   Collection Time: 05/09/19  8:52 PM  Result Value Ref Range Status   Specimen Description   Final    ABSCESS RIGHT LEG Performed at [facility], [address] [place], [place] [address]    Special Requests   Final    Normal Performed at [facility], [address] [place], [place] [address]    Gram Stain   Final    ABUNDANT WBC PRESENT, PREDOMINANTLY PMN MODERATE GRAM POSITIVE COCCI Performed at [facility], [address] [place], [place] [address]    Culture   Final    MODERATE STAPHYLOCOCCUS AUREUS NO ANAEROBES ISOLATED; CULTURE IN PROGRESS FOR 5 DAYS    Report Status PENDING  Incomplete   Organism ID, Bacteria STAPHYLOCOCCUS AUREUS  Final      Susceptibility   Staphylococcus aureus - MIC*    CIPROFLOXACIN >=8 RESISTANT Resistant     ERYTHROMYCIN >=8 RESISTANT Resistant     GENTAMICIN <=0.5 SENSITIVE Sensitive     OXACILLIN 0.5 SENSITIVE Sensitive     TETRACYCLINE <=1 SENSITIVE Sensitive     VANCOMYCIN 1 SENSITIVE Sensitive     TRIMETH/SULFA <=10 SENSITIVE Sensitive     CLINDAMYCIN <=0.25 SENSITIVE Sensitive     RIFAMPIN <=0.5 SENSITIVE Sensitive     Inducible Clindamycin NEGATIVE Sensitive     * MODERATE STAPHYLOCOCCUS AUREUS  MRSA PCR Screening     Status: None   Collection Time: 05/10/19 11:35 PM  Result Value Ref Range Status   MRSA by PCR NEGATIVE NEGATIVE Final    Comment:        The GeneXpert MRSA Assay (FDA approved for NASAL specimens only), is one component of a comprehensive MRSA colonization surveillance program. It is not intended to diagnose MRSA infection nor to guide or monitor treatment for MRSA infections. Performed at  [facility], [address] [place], [place] [address]   Aerobic/Anaerobic Culture (surgical/deep wound)     Status: None (Preliminary result)   Collection Time: 05/11/19  1:50 PM  Result Value Ref Range Status   Specimen Description ABSCESS RIGHT LEG  Final   Special Requests NONE  Final   Gram Stain   Final    ABUNDANT WBC PRESENT, PREDOMINANTLY PMN FEW GRAM POSITIVE COCCI    Culture   Final    FEW STAPHYLOCOCCUS AUREUS SUSCEPTIBILITIES TO FOLLOW Performed at [facility], [address] [place], [place] [address]    Report Status PENDING  Incomplete  Aerobic/Anaerobic Culture (surgical/deep wound)     Status: None (Preliminary result)   Collection Time: 05/11/19  2:29 PM  Result Value Ref Range Status   Specimen Description ABSCESS NECK  Final   Special Requests NONE  Final   Gram Stain   Final    ABUNDANT WBC PRESENT,BOTH PMN AND MONONUCLEAR FEW GRAM POSITIVE COCCI    Culture   Final    FEW STAPHYLOCOCCUS AUREUS SUSCEPTIBILITIES TO FOLLOW Performed at [facility], [address] [place], [place] [address]    Report Status PENDING  Incomplete    Radiology Studies: Ct Soft Tissue Neck W Contrast  Result Date: 05/10/2019 CLINICAL DATA:  Right neck abscess. EXAM: CT NECK WITH CONTRAST TECHNIQUE: Multidetector CT imaging of the neck was performed using the standard protocol following the bolus administration of intravenous contrast. CONTRAST:  75mL OMNIPAQUE IOHEXOL 300 MG/ML  SOLN COMPARISON:  CT neck without contrast 05/10/2019 FINDINGS: PHARYNX AND LARYNX: --Nasopharynx: Fossae  of Rosenmuller are clear. Normal adenoid tonsils for age. --Oral cavity and oropharynx: The palatine and lingual tonsils are normal. The visible oral cavity and floor of mouth are normal. --Hypopharynx: Normal vallecula and pyriform sinuses. --Larynx: Normal epiglottis and pre-epiglottic space. Normal aryepiglottic and vocal folds. --Retropharyngeal space: No abscess, effusion or  lymphadenopathy. SALIVARY GLANDS: --Parotid: No mass lesion or inflammation. No sialolithiasis or ductal dilatation. --Submandibular: Symmetric without inflammation. No sialolithiasis or ductal dilatation. --Sublingual: Normal. No ranula or other visible lesion of the base of tongue and floor of mouth. THYROID: Normal. LYMPH NODES: Right level 5A nodes measure up to 11 mm. Right level 3 nodes measure up to 10 mm. VASCULAR: No internal jugular vein thrombosis. Major vessels are normal. LIMITED INTRACRANIAL: Normal. VISUALIZED ORBITS: Normal. MASTOIDS AND VISUALIZED PARANASAL SINUSES: No fluid levels or advanced mucosal thickening. No mastoid effusion. SKELETON: No bony spinal canal stenosis. No lytic or blastic lesions. UPPER CHEST: Clear. OTHER: Cutaneous/subcutaneous phlegmon of the right posterior triangle with edema of the adjacent posterior neck muscles and right sternocleidomastoid. No discrete fluid collection. Moderate surrounding skin thickening. IMPRESSION: 1. Redemonstration of cutaneous/subcutaneous phlegmon of the right neck posterior triangle without drainable fluid collection. 2. Edema of the right sternocleidomastoid muscle and right posterior neck musculature suggestive of myositis. 3. No internal jugular vein thrombosis. 4. Reactive right level 3 and 5A lymphadenopathy. Electronically Signed   By: Deatra Robinson M.D.   On: 05/10/2019 22:00   Scheduled Meds:  acetaminophen  650 mg Oral Q6H   citalopram  20 mg Oral Daily   enoxaparin (LOVENOX) injection  40 mg Subcutaneous QHS   gabapentin  800 mg Oral TID   metoprolol tartrate  25 mg Oral BID   Continuous Infusions:   ceFAZolin (ANCEF) IV 2 g (05/12/19 1338)    LOS: 3 days   Azucena Fallen, DO [facility] PAGER is on AMION  If 7PM-7AM, please contact night-coverage [email or web address] Password [facility] 05/12/2019, 4:52 PM

## 2019-05-12 NOTE — Progress Notes (Signed)
[facility]  Date of Admission:  05/09/2019     Total days of antibiotics 4         ASSESSMENT/PLAN  Ms. Runnels has right lower extremity and neck abscesses s/p incision and drainage with initial cultures positive for MSSA. Surgical specimens obtained from I&D with gram positive cocci on gram stain and cultures pending, although would suspect MSSA.  MSSA abscess - POD 1 with continued surgical pain. Plan for repeat I&D on Monday. Currently on Day 2 of Ancef and tolerating it with no adverse side effects. Plan for 14 days of oral therapy following repeat I&D. Continue current dose of Ancef. Wound care per general surgery and orthopedics.   Peripheral neuropathy - Stable with current dose of gabapentin. Continue management per primary team.   Principal Problem:   Soft tissue abscess Active Problems:   Normocytic anemia   Psoriasis   History of nephrolithiasis   Obesity   Neuropathy   Hypertension   Cellulitis and abscess of right lower extremity   . acetaminophen  650 mg Oral Q6H  . citalopram  20 mg Oral Daily  . enoxaparin (LOVENOX) injection  40 mg Subcutaneous QHS  . gabapentin  800 mg Oral TID  . metoprolol tartrate  25 mg Oral BID    SUBJECTIVE:  Afebrile overnight with no leukocytosis. Having pain in the surgical areas that comes and goes and worsened with dressing changes.   No Known Allergies   Review of Systems: Review of Systems  Constitutional: Negative for chills, fever and weight loss.  Respiratory: Negative for cough, shortness of breath and wheezing.   Cardiovascular: Negative for chest pain and leg swelling.  Gastrointestinal: Negative for abdominal pain, constipation, diarrhea, nausea and vomiting.  Musculoskeletal:       Positive for right lower extremity and neck pain.   Skin: Negative for rash.      OBJECTIVE: Vitals:   05/11/19 2027 05/12/19 0348 05/12/19 0922 05/12/19 0923  BP: 135/88 136/84 125/83   Pulse: 99  70 82 84  Resp: Temp: 98.4 F (36.9 C) 97.8 F (36.6 C)  98.5 F (36.9 C)  TempSrc: Oral Oral  Oral  SpO2: 98% 100%  100%  Weight:      Height:       Body mass index is 42.98 kg/m.  Physical Exam Constitutional:      General: She is not in acute distress.    Appearance: She is well-developed.     Comments: Seated in the chair; pleasant.   Cardiovascular:     Rate and Rhythm: Normal rate and regular rhythm.     Heart sounds: Normal heart sounds.  Pulmonary:     Effort: Pulmonary effort is normal.     Breath sounds: Normal breath sounds.  Musculoskeletal:     Comments: Surgical dressings are clean and dry.   Skin:    General: Skin is warm and dry.  Neurological:     Mental Status: She is alert and oriented to person, place, and time.  Psychiatric:        Mood and Affect: Mood normal.     Lab Results Lab Results  Component Value Date   WBC 7.3 05/12/2019   HGB 10.5 (L) 05/12/2019   HCT 31.4 (L) 05/12/2019   MCV 90.0 05/12/2019   PLT 319 05/12/2019    Lab Results  Component Value Date   CREATININE 0.59 05/12/2019   BUN 7 05/12/2019  NA 140 05/12/2019   K 3.4 (L) 05/12/2019   CL 105 05/12/2019   CO2 25 05/12/2019    Lab Results  Component Value Date   ALT 36 09/11/2017   AST 41 09/11/2017   ALKPHOS 144 (H) 09/11/2017   BILITOT 0.5 09/11/2017     Microbiology: Recent Results (from the past 240 hour(s))  Blood culture (routine x 2)     Status: None (Preliminary result)   Collection Time: 05/09/19  7:26 PM  Result Value Ref Range Status   Specimen Description   Final    BLOOD LEFT ANTECUBITAL Performed at [facility], [address], [place], [place] [address]    Special Requests   Final    BOTTLES DRAWN AEROBIC AND ANAEROBIC Blood Culture results Gonet not be optimal due to an excessive volume of blood received in culture bottles Performed at [facility], [address], [place], [place] [address]     Culture   Final    NO GROWTH 3 DAYS Performed at [facility], [address], [place], [place] [address]    Report Status PENDING  Incomplete  Blood culture (routine x 2)     Status: None (Preliminary result)   Collection Time: 05/09/19  7:26 PM  Result Value Ref Range Status   Specimen Description   Final    BLOOD RIGHT ANTECUBITAL Performed at [facility], [address], [place], [place] [address]    Special Requests   Final    BOTTLES DRAWN AEROBIC AND ANAEROBIC Blood Culture adequate volume Performed at [facility], [address] [place], [place] [address]    Culture   Final    NO GROWTH 3 DAYS Performed at [facility], [address] [place], [place] [address]    Report Status PENDING  Incomplete  SARS Coronavirus 2 (CEPHEID - Performed in [facility] hospital lab), Hosp Order     Status: None   Collection Time: 05/09/19  7:26 PM  Result Value Ref Range Status   SARS Coronavirus 2 NEGATIVE NEGATIVE Final    Comment: (NOTE) If result is NEGATIVE SARS-CoV-2 target nucleic acids are NOT DETECTED. The SARS-CoV-2 RNA is generally detectable in upper and lower  respiratory specimens during the acute phase of infection. The lowest  concentration of SARS-CoV-2 viral copies this assay can detect is 250  copies / mL. A negative result does not preclude SARS-CoV-2 infection  and should not be used as the sole basis for treatment or other  patient management decisions.  A negative result Tatro occur with  improper specimen collection / handling, submission of specimen other  than nasopharyngeal swab, presence of viral mutation(s) within the  areas targeted by this assay, and inadequate number of viral copies  (<250 copies / mL). A negative result must be combined with clinical  observations, patient history, and epidemiological information. If result is POSITIVE SARS-CoV-2 target nucleic acids are DETECTED. The SARS-CoV-2 RNA  is generally detectable in upper and lower  respiratory specimens dur ing the acute phase of infection.  Positive  results are indicative of active infection with SARS-CoV-2.  Clinical  correlation with patient history and other diagnostic information is  necessary to determine patient infection status.  Positive results do  not rule out bacterial infection or co-infection with other viruses. If result is PRESUMPTIVE POSTIVE SARS-CoV-2 nucleic acids Sebree BE PRESENT.   A presumptive positive result was obtained on the submitted specimen  and confirmed on repeat testing.  While 2019 novel coronavirus  (SARS-CoV-2) nucleic acids Dimitroff be present in the submitted sample  additional confirmatory testing Goldberg be necessary for epidemiological  and / or clinical management purposes  to differentiate between  SARS-CoV-2 and other Sarbecovirus currently known to infect humans.  If clinically indicated additional testing with an alternate test  methodology [id number]) is advised. The SARS-CoV-2 RNA is generally  detectable in upper and lower respiratory sp ecimens during the acute  phase of infection. The expected result is Negative. Fact Sheet for Patients:  BoilerBrush.com.cy Fact Sheet for Healthcare Providers: https://pope.com/ This test is not yet approved or cleared by the [place] FDA and has been authorized for detection and/or diagnosis of SARS-CoV-2 by FDA under an Emergency Use Authorization (EUA).  This EUA will remain in effect (meaning this test can be used) for the duration of the COVID-19 declaration under Section 564(b)(1) of the Act, 21 U.S.C. section 360bbb-3(b)(1), unless the authorization is terminated or revoked sooner. Performed at [facility], [address] [place], [place] [address]   Wound or Superficial Culture     Status: None (Preliminary result)   Collection Time: 05/09/19  8:52 PM  Result Value  Ref Range Status   Specimen Description   Final    WOUND RIGHT LEG Performed at [facility], [address], [place], [place] [address]    Special Requests   Final    Normal Performed at [facility], [address] [place], [place] [address]    Gram Stain   Final    FEW WBC PRESENT,BOTH PMN AND MONONUCLEAR FEW GRAM POSITIVE COCCI Performed at [facility], [address], [place], [place] [address]    Culture MODERATE STAPHYLOCOCCUS AUREUS  Final   Report Status PENDING  Incomplete   Organism ID, Bacteria STAPHYLOCOCCUS AUREUS  Final      Susceptibility   Staphylococcus aureus - MIC*    CIPROFLOXACIN >=8 RESISTANT Resistant     ERYTHROMYCIN >=8 RESISTANT Resistant     GENTAMICIN <=0.5 SENSITIVE Sensitive     OXACILLIN 0.5 SENSITIVE Sensitive     TETRACYCLINE <=1 SENSITIVE Sensitive     VANCOMYCIN 1 SENSITIVE Sensitive     TRIMETH/SULFA <=10 SENSITIVE Sensitive     CLINDAMYCIN <=0.25 SENSITIVE Sensitive     RIFAMPIN <=0.5 SENSITIVE Sensitive     Inducible Clindamycin NEGATIVE Sensitive     * MODERATE STAPHYLOCOCCUS AUREUS  Aerobic/Anaerobic Culture (surgical/deep wound)     Status: None (Preliminary result)   Collection Time: 05/09/19  8:52 PM  Result Value Ref Range Status   Specimen Description   Final    ABSCESS RIGHT LEG Performed at [facility], [address], [place], [place] [address]    Special Requests   Final    Normal Performed at [facility], [address], [place], [place] [address]    Gram Stain   Final    ABUNDANT WBC PRESENT, PREDOMINANTLY PMN MODERATE GRAM POSITIVE COCCI Performed at [facility], [address] [place], [place] [address]    Culture MODERATE STAPHYLOCOCCUS AUREUS  Final   Report Status PENDING  Incomplete   Organism ID, Bacteria STAPHYLOCOCCUS AUREUS  Final      Susceptibility   Staphylococcus aureus - MIC*    CIPROFLOXACIN >=8 RESISTANT  Resistant     ERYTHROMYCIN >=8 RESISTANT Resistant     GENTAMICIN <=0.5 SENSITIVE Sensitive  OXACILLIN 0.5 SENSITIVE Sensitive     TETRACYCLINE <=1 SENSITIVE Sensitive     VANCOMYCIN 1 SENSITIVE Sensitive     TRIMETH/SULFA <=10 SENSITIVE Sensitive     CLINDAMYCIN <=0.25 SENSITIVE Sensitive     RIFAMPIN <=0.5 SENSITIVE Sensitive     Inducible Clindamycin NEGATIVE Sensitive     * MODERATE STAPHYLOCOCCUS AUREUS  MRSA PCR Screening     Status: None   Collection Time: 05/10/19 11:35 PM  Result Value Ref Range Status   MRSA by PCR NEGATIVE NEGATIVE Final    Comment:        The GeneXpert MRSA Assay (FDA approved for NASAL specimens only), is one component of a comprehensive MRSA colonization surveillance program. It is not intended to diagnose MRSA infection nor to guide or monitor treatment for MRSA infections. Performed at [facility], [address] [place], [place] [address]   Aerobic/Anaerobic Culture (surgical/deep wound)     Status: None (Preliminary result)   Collection Time: 05/11/19  1:50 PM  Result Value Ref Range Status   Specimen Description ABSCESS RIGHT LEG  Final   Special Requests NONE  Final   Gram Stain   Final    ABUNDANT WBC PRESENT, PREDOMINANTLY PMN FEW GRAM POSITIVE COCCI Performed at [facility], [address] [place], [place] [address]    Culture PENDING  Incomplete   Report Status PENDING  Incomplete  Aerobic/Anaerobic Culture (surgical/deep wound)     Status: None (Preliminary result)   Collection Time: 05/11/19  2:29 PM  Result Value Ref Range Status   Specimen Description ABSCESS NECK  Final   Special Requests NONE  Final   Gram Stain   Final    ABUNDANT WBC PRESENT,BOTH PMN AND MONONUCLEAR FEW GRAM POSITIVE COCCI Performed at [facility], [address] [place], [place] [address]    Culture PENDING  Incomplete   Report Status PENDING  Incomplete     Marcos EkeGreg Khyleigh Furney, NP [facility] [phone] Pager  05/12/2019  11:54 AM

## 2019-05-12 NOTE — Progress Notes (Signed)
Patient neck and right leg wound dressing changed at 6am this morning.

## 2019-05-12 NOTE — Progress Notes (Signed)
Dressing changed to RLE and posterior neck per order.

## 2019-05-12 NOTE — Progress Notes (Signed)
IV infiltrated. Small red tender area Pt hard stick. IV team called. Midline catheter placed in left upper arm.

## 2019-05-12 NOTE — Plan of Care (Signed)

## 2019-05-12 NOTE — Progress Notes (Signed)
Subjective: 1 Day Post-Op Procedure(s) (LRB): IRRIGATION AND DEBRIDEMENT LEG AND NECK ABSCESS (Right) Irrigation And Debridement Wound Patient reports pain as mild.  Feeling well this am.  No events overnight  Objective: Vital signs in last 24 hours: Temp:  [97.8 F (36.6 C)-98.7 F (37.1 C)] 97.8 F (36.6 C) (06/05 0348) Pulse Rate:  [70-105] 70 (06/05 0348) Resp:  [10-18] 15 (06/05 0348) BP: (130-148)/(84-92) 136/84 (06/05 0348) SpO2:  [92 %-100 %] 100 % (06/05 0348)  Intake/Output from previous day: 06/04 0701 - 06/05 0700 In: 1220 [P.O.:120; I.V.:1000; IV Piggyback:100] Out: 75 [Blood:75] Intake/Output this shift: No intake/output data recorded.  Recent Labs    05/09/19 1926 05/10/19 0420 05/12/19 0124  HGB 11.0* 10.9* 10.5*   Recent Labs    05/10/19 0420 05/12/19 0124  WBC 8.3 7.3  RBC 3.53* 3.49*  HCT 33.5* 31.4*  PLT 300 319   Recent Labs    05/10/19 0420 05/12/19 0124  NA 138 140  K 3.6 3.4*  CL 107 105  CO2 25 25  BUN 7 7  CREATININE 0.61 0.59  GLUCOSE 95 120*  CALCIUM 8.2* 8.5*   No results for input(s): LABPT, INR in the last 72 hours.  Neurologically intact Neurovascular intact Intact pulses distally Dorsiflexion/Plantar flexion intact Incision: dressing C/D/I Compartment soft  Wet-to-dry dressing repacked this am    Assessment/Plan: 1 Day Post-Op Procedure(s) (LRB): IRRIGATION AND DEBRIDEMENT LEG AND NECK ABSCESS (Right) Irrigation And Debridement Wound Up with therapy  WBAT RLE Continue with wet-to-dry dressing changes bid Intra-op gram stain showing gram positive cocci Continue with iv cefazolin per ID Plan for repeat I&D Monday afternoon.  Will need to be NPO after midnight sunday      Cristie Hem 05/12/2019, 7:54 AM

## 2019-05-13 LAB — CBC
HCT: 32.2 % — ABNORMAL LOW (ref 36.0–46.0)
Hemoglobin: 10.6 g/dL — ABNORMAL LOW (ref 12.0–15.0)
MCH: 30.1 pg (ref 26.0–34.0)
MCHC: 32.9 g/dL (ref 30.0–36.0)
MCV: 91.5 fL (ref 80.0–100.0)
Platelets: 342 10*3/uL (ref 150–400)
RBC: 3.52 MIL/uL — ABNORMAL LOW (ref 3.87–5.11)
RDW: 12.8 % (ref 11.5–15.5)
WBC: 5.5 10*3/uL (ref 4.0–10.5)
nRBC: 0 % (ref 0.0–0.2)

## 2019-05-13 LAB — BASIC METABOLIC PANEL
Anion gap: 10 (ref 5–15)
BUN: 10 mg/dL (ref 6–20)
CO2: 26 mmol/L (ref 22–32)
Calcium: 8.6 mg/dL — ABNORMAL LOW (ref 8.9–10.3)
Chloride: 105 mmol/L (ref 98–111)
Creatinine, Ser: 0.65 mg/dL (ref 0.44–1.00)
GFR calc Af Amer: >60 mL/min (ref >60)
GFR calc non Af Amer: >60 mL/min (ref >60)
Glucose, Bld: 96 mg/dL (ref 70–99)
Potassium: 3.4 mmol/L — ABNORMAL LOW (ref 3.5–5.1)
Sodium: 141 mmol/L (ref 135–145)

## 2019-05-13 NOTE — Progress Notes (Signed)
[facility] [place] [facility]/Trauma Progress Note  2 Days Post-Op   Assessment/Plan Homeless Hypertension  Lower extremity neuropathy  Abscess right posterior neck - S/P excisional debridement R posterior neck abscess with irrigation, Dr. Redmond Pulling, 06/04 - BID wet to dry dressing changes, no further surgery indicated at this time - cultures show staph aureus  - we will see again on Monday, please page [place] with any emergent needs  Abscess right lower leg - S/P irrigation and debridement of 2 R lower let abscesses, Dr. Erlinda Hong, 06/04 - plan to return to OR Monday  FEN:reg diet FM:[id number] 6/2-06/04 Ancef 06/04>>  per ID,  WBC WNL QZR:[id number] Follow up: TBD POC: Pt is homeless and Spouse lives in a tent.  Plan:cultures show staph aureus, ID following. Wet to dry of neck wound BID.    LOS: 4 days    Subjective: CC: RLE and neck wound pain  No issues overnight. Neck pain improved. Red spot on R knee she is worried is an abscess. Instructed pt to inform Dr. Erlinda Hong to evaluate Monday before surgery.   Objective: Vital signs in last 24 hours: Temp:  [97.4 F (36.3 C)-98.5 F (36.9 C)] 98.5 F (36.9 C) (06/06 0616) Pulse Rate:  [74-75] 74 (06/06 0616) Resp:  [18-20] 18 (06/06 0500) BP: (134-152)/(82-87) 152/82 (06/06 0616) SpO2:  [98 %-100 %] 100 % (06/06 0616) Last BM Date: 05/12/19  Intake/Output from previous day: 06/05 0701 - 06/06 0700 In: 1000 [P.O.:1000] Out: -  Intake/Output this shift: No intake/output data recorded.  PE: Gen: Alert, NAD, pleasant, cooperative Neck: wound looks clean, no purulent drainage, improved erythema and induration.  Pulm:Rate andeffort normal Extremities: RLE with ACE and what looks like a small abscess of lateral R knee Skin:warm and dry   Anti-infectives: Anti-infectives (From admission, onward)   Start     Dose/Rate Route Frequency Ordered Stop   05/11/19 1400  ceFAZolin (ANCEF) IVPB 2g/100 mL premix      2 g 200 mL/hr over 30 Minutes Intravenous Every 8 hours 05/11/19 1107     05/10/19 2000  vancomycin (VANCOCIN) 1,500 mg in sodium chloride 0.9 % 500 mL IVPB  Status:  Discontinued     1,500 mg 250 mL/hr over 120 Minutes Intravenous Every 24 hours 05/10/19 0403 05/11/19 1107   05/10/19 0600  piperacillin-tazobactam (ZOSYN) IVPB 3.375 g  Status:  Discontinued     3.375 g 12.5 mL/hr over 240 Minutes Intravenous Every 8 hours 05/09/19 2227 05/10/19 1537   05/09/19 2245  vancomycin (VANCOCIN) IVPB 1000 mg/200 mL premix  Status:  Discontinued     1,000 mg 200 mL/hr over 60 Minutes Intravenous  Once 05/09/19 2242 05/09/19 2249   05/09/19 2230  piperacillin-tazobactam (ZOSYN) IVPB 3.375 g     3.375 g 100 mL/hr over 30 Minutes Intravenous  Once 05/09/19 2227 05/10/19 0118   05/09/19 1945  vancomycin (VANCOCIN) 2,000 mg in sodium chloride 0.9 % 500 mL IVPB     2,000 mg 250 mL/hr over 120 Minutes Intravenous  Once 05/09/19 1937 05/09/19 2158   05/09/19 1930  levofloxacin (LEVAQUIN) IVPB 750 mg     750 mg 100 mL/hr over 90 Minutes Intravenous  Once 05/09/19 1926 05/10/19 0036      Lab Results:  Recent Labs    05/12/19 0124 05/13/19 0228  WBC 7.3 5.5  HGB 10.5* 10.6*  HCT 31.4* 32.2*  PLT 319 342   BMET Recent Labs    05/12/19 0124 05/13/19 0228  NA 140 141  K 3.4*  3.4*  CL 105 105  CO2 25 26  GLUCOSE 120* 96  BUN 7 10  CREATININE 0.59 0.65  CALCIUM 8.5* 8.6*   PT/INR No results for input(s): LABPROT, INR in the last 72 hours. CMP     Component Value Date/Time   NA 141 05/13/2019 0228   NA 139 02/06/2013 0449   K 3.4 (L) 05/13/2019 0228   K 3.1 (L) 02/06/2013 0449   CL 105 05/13/2019 0228   CL 107 02/06/2013 0449   CO2 26 05/13/2019 0228   CO2 27 02/06/2013 0449   GLUCOSE 96 05/13/2019 0228   GLUCOSE 91 02/06/2013 0449   BUN 10 05/13/2019 0228   BUN 9 02/06/2013 0449   CREATININE 0.65 05/13/2019 0228   CREATININE 0.67 02/06/2013 0449   CALCIUM 8.6 (L)  05/13/2019 0228   CALCIUM 8.2 (L) 02/06/2013 0449   PROT 7.4 09/11/2017 2313   PROT 7.2 08/04/2012 0030   ALBUMIN 3.8 09/11/2017 2313   ALBUMIN 3.7 08/04/2012 0030   AST 41 09/11/2017 2313   AST 25 08/04/2012 0030   ALT 36 09/11/2017 2313   ALT 21 08/04/2012 0030   ALKPHOS 144 (H) 09/11/2017 2313   ALKPHOS 65 08/04/2012 0030   BILITOT 0.5 09/11/2017 2313   BILITOT 0.4 08/04/2012 0030   GFRNONAA >60 05/13/2019 0228   GFRNONAA >60 02/06/2013 0449   GFRAA >60 05/13/2019 0228   GFRAA >60 02/06/2013 0449   Lipase     Component Value Date/Time   LIPASE 55 (L) 08/04/2012 0030    Studies/Results: No results found.    Jerre SimonJessica L Focht , [facility] 05/13/2019, 9:53 AM  Pager: [phone] Mon-Wed, Friday 7:00am-4:30pm Thurs 7am-11:30am  Consults: [phone]

## 2019-05-13 NOTE — Progress Notes (Signed)
PROGRESS NOTE    Brianna Randall  ZOX:[id number] DOB: Jan 23, 1971 DOA: 05/09/2019 PCP: Lavinia Sharps, NP  Brief Narrative:  Brianna Randall is a 48 y.o. female with a known history of peripheral neuropathy, kidney stones, homelessness presents to the emergency department for evaluation of an insect bite.  Patient was in a usual state of health until 6 days ago she noticed that she had several insect bites after swimming in a river.  The bites were on her right lower leg and right lateral neck.  She developed redness and swelling within the last 24 hours, not associated with any fevers, chills. Patient denies fevers/chills, weakness, dizziness, chest pain, shortness of breath, N/V/C/D, abdominal pain, dysuria/frequency, changes in mental status. Otherwise there has been no change in status. Patient has been taking medication as prescribed and there has been no recent change in medication or diet.  No recent antibiotics.  There has been no recent illness, hospitalizations, travel or sick contacts. In ED patient received Levaquin. Medical admission has been requested for further management of right lower extremity cellulitis with abscess, right neck cellulitis. General surgery and infectious diseases were consulted for further evaluation recommendations.  General surgery is obtaining a CT of the soft tissue of the neck.  General surgery also recommended orthopedic evaluation for her right lower leg and I spoke with Dr. Roda Shutters who will see the patient in consultation but requested patient be transferred to [facility] for incision and drainage in OR.  Assessment & Plan:   Principal Problem:   Soft tissue abscess Active Problems:   Normocytic anemia   Psoriasis   History of nephrolithiasis   Obesity   Neuropathy   Hypertension   Cellulitis and abscess of right lower extremity  Purulent cellulitis of the right lateral neck and right lower leg, POA, improving;  -likely staph aureus per preliminary report -Patient  does not meet sepsis criteria - no fevers/leukocytosis overnight -Admitted Inpatient given severity and need for likely prolonged IV Abx -Continue cefazolin only per ID; Previously on IV Zosyn/vancomycin as well -Pain control with po Tramadol 50 mg q6hprn Moderate Pain and IV Morphine 1 mg q4hprn Severe Pain  -Follow-up cultures - prelim report Staph aureus on RLE culture -S/P I/D 05/11/19 - needs repeat debridement 05/15/19 - will need to be NPO /03/20  2000  vancomycin (VANCOCIN) 1,500 mg in sodium chloride 0.9 % 500 mL IVPB  Status:  Discontinued     1,500 mg 250 mL/hr over 120 Minutes Intravenous Every 24 hours 05/10/19 0403 05/11/19 1107   05/10/19 0600  piperacillin-tazobactam  (ZOSYN) IVPB 3.375 g  Status:  Discontinued     3.375 g 12.5 mL/hr over 240 Minutes Intravenous Every 8 hours 05/09/19 2227 05/10/19 1537   05/09/19 2245  vancomycin (VANCOCIN) IVPB 1000 mg/200 mL premix  Status:  Discontinued     1,000 mg 200 mL/hr over 60 Minutes Intravenous  Once 05/09/19 2242 05/09/19 2249   05/09/19 2230  piperacillin-tazobactam (ZOSYN) IVPB 3.375 g     3.375 g 100 mL/hr over 30 Minutes Intravenous  Once 05/09/19 2227 05/10/19 0118   05/09/19 1945  vancomycin (VANCOCIN) 2,000 mg in sodium chloride 0.9 % 500 mL IVPB     2,000 mg 250 mL/hr over 120 Minutes Intravenous  Once 05/09/19 1937 05/09/19 2158   05/09/19 1930  levofloxacin (LEVAQUIN) IVPB 750 mg     750 mg 100 mL/hr over 90 Minutes Intravenous  Once 05/09/19 1926 05/10/19 0036     Subjective: No acute issues or events overnight, patient tolerated procedure quite well, denies any current pain; breath, chest pain, nausea, vomiting, diarrhea, constipation, headache, fevers, chills.  Objective: Vitals:   05/12/19 1554 05/12/19 2054 05/13/19 0500 05/13/19 0616  BP: 134/86 (!) 145/87  (!) 152/82  Pulse: 75 75  74  Resp: 20  18   Temp: 98.2 F (36.8 C) (!) 97.4 F (36.3 C)  98.5 F (36.9 C)  TempSrc: Oral Oral  Oral  SpO2: 98% 100%  100%  Weight:      Height:        Intake/Output Summary (Last 24 hours) at 05/13/2019 [id number] Last data filed at 05/12/2019 2142 Gross per 24 hour  Intake 1000 ml  Output --  Net 1000 ml   Filed Weights   05/09/19 1845  Weight: 106.6 kg   Examination: Physical Exam:  Constitutional: WN/WD morbidly obese Caucasian female in NAD and appears calm but slightly uncomfortable Eyes: Lids and conjunctivae normal, sclerae anicteric  ENMT: External Ears, Nose appear normal. Grossly normal hearing. Mucous membranes are moist.  Neck: Posterolateral bandage clean dry intact Respiratory: Diminished to auscultation bilaterally, no wheezing, rales, rhonchi or crackles. Normal  respiratory effort and patient is not tachypenic. No accessory muscle use.  Cardiovascular: RRR, no murmurs / rubs / gallops. S1 and S2 auscultated. 1+ LE extremity edema.  Abdomen: Soft, non-tender, Distended due to body habitus. No masses palpated. No appreciable hepatosplenomegaly. Bowel sounds positive x4.  GU: Deferred. Musculoskeletal: No clubbing / cyanosis of digits/nails. No joint deformity upper and lower extremities.  Skin: Right Neck and Right Leg bandages clean, dry, intact Neurologic: CN 2-12 grossly intact with no focal deficits. Romberg sign and cerebellar reflexes not assessed.  Psychiatric: Normal judgment and insight. Alert and oriented x 3. Normal mood and appropriate affect.   Data Reviewed: I have personally reviewed following labs and imaging studies  CBC: Recent Labs  Lab 05/09/19 1926 05/10/19 0420 05/12/19 0124 05/13/19 0228  WBC 7.6 8.3 7.3 5.5  NEUTROABS 5.9  --   --   --   HGB 11.0* 10.9* 10.5* 10.6*  HCT 34.9* 33.5* 31.4* 32.2*  MCV 93.6 94.9 90.0 91.5  PLT 332 300 319 342   Basic Metabolic Panel: Recent Labs  Lab 05/09/19 1926 05/10/19 0420 05/12/19 0124 05/13/19 0228  NA 140  138 140 141  K 3.1* 3.6 3.4* 3.4*  CL 108 107 105 105  CO2 GLUCOSE 92 95 120* 96  BUN CREATININE 0.71 0.61 0.59 0.65  CALCIUM 8.6* 8.2* 8.5* 8.6*  MG  --  1.8  --   --   PHOS  --  3.6  --   --    GFR: Estimated Creatinine Clearance: 97.6 mL/min (by C-G formula based on SCr of 0.65 mg/dL). Liver Function Tests: No results for input(s): AST, ALT, ALKPHOS, BILITOT, PROT, ALBUMIN in the last 168 hours. No results for input(s): LIPASE, AMYLASE in the last 168 hours. No results for input(s): AMMONIA in the last 168 hours. Coagulation Profile: No results for input(s): INR, PROTIME in the last 168 hours. Cardiac Enzymes: No results for input(s): CKTOTAL, CKMB, CKMBINDEX, TROPONINI in the last 168 hours. BNP (last 3 results) No results for  input(s): PROBNP in the last 8760 hours. HbA1C: No results for input(s): HGBA1C in the last 72 hours. CBG: No results for input(s): GLUCAP in the last 168 hours. Lipid Profile: No results for input(s): CHOL, HDL, LDLCALC, TRIG, CHOLHDL, LDLDIRECT in the last 72 hours. Thyroid Function Tests: No results for input(s): TSH, T4TOTAL, FREET4, T3FREE, THYROIDAB in the last 72 hours. Anemia Panel: No results for input(s): VITAMINB12, FOLATE, FERRITIN, TIBC, IRON, RETICCTPCT in the last 72 hours. Sepsis Labs: Recent Labs  Lab 05/09/19 1926  LATICACIDVEN 0.6    Recent Results (from the past 240 hour(s))  Blood culture (routine x 2)     Status: None (Preliminary result)   Collection Time: 05/09/19  7:26 PM  Result Value Ref Range Status   Specimen Description   Final    BLOOD LEFT ANTECUBITAL Performed at [facility] [address] [place], [place] [address]    Special Requests   Final    BOTTLES DRAWN AEROBIC AND ANAEROBIC Blood Culture results [place] not be optimal due to an excessive volume of blood received in culture bottles Performed at [facility], [address] [place], [place] [address]    Culture   Final    NO GROWTH 4 DAYS Performed at [facility], [address] [place], [place] [address]    Report Status PENDING  Incomplete  Blood culture (routine x 2)     Status: None (Preliminary result)   Collection Time: 05/09/19  7:26 PM  Result Value Ref Range Status   Specimen Description   Final    BLOOD RIGHT ANTECUBITAL Performed at [facility], [address], [place], [place] [address]    Special Requests   Final    BOTTLES DRAWN AEROBIC AND ANAEROBIC Blood Culture adequate volume Performed at [facility], [address] [place], [place] [address]    Culture   Final    NO GROWTH 4 DAYS Performed at [facility], [address] [place], [place] [address]    Report Status  PENDING  Incomplete  SARS Coronavirus 2 (CEPHEID - Performed in [facility] hospital lab), Hosp Order     Status: None   Collection Time: 05/09/19  7:26 PM  Result Value Ref Range Status   SARS Coronavirus 2 NEGATIVE NEGATIVE Final    Comment: (NOTE) If result is NEGATIVE SARS-CoV-2 target nucleic acids are NOT DETECTED. The SARS-CoV-2 RNA is generally detectable in upper and lower  respiratory specimens during the acute phase of infection. The lowest  concentration of SARS-CoV-2 viral copies this assay can detect is 250  copies / mL. A negative result does not preclude SARS-CoV-2 infection  and should not be used as the sole basis for treatment or other  patient management decisions.  A negative result [place] occur with  improper specimen collection / handling, submission of specimen other  than nasopharyngeal swab, presence of viral mutation(s) within the  areas targeted by this assay, and inadequate number of viral copies  (<250 copies / mL). A negative result must be combined with clinical  observations, patient history, and epidemiological information. If result is POSITIVE SARS-CoV-2 target nucleic acids are DETECTED. The SARS-CoV-2 RNA is generally detectable in upper and lower  respiratory specimens dur ing the acute phase of infection.  Positive  results are indicative of active infection with SARS-CoV-2.  Clinical  correlation with patient history and other diagnostic information is  necessary to determine patient infection status.  Positive results do  not rule out bacterial infection or co-infection with other viruses. If result is PRESUMPTIVE POSTIVE SARS-CoV-2 nucleic acids [place] BE PRESENT.   A presumptive positive result was obtained on the submitted specimen  and confirmed on repeat testing.  While 2019 novel coronavirus  (SARS-CoV-2) nucleic acids [place] be present in the submitted sample  additional confirmatory testing [place] be necessary for epidemiological  and / or  clinical management purposes  to differentiate between  SARS-CoV-2 and other Sarbecovirus currently known to infect humans.  If clinically indicated additional testing with an alternate test  methodology [phone] is advised. The SARS-CoV-2 RNA is generally  detectable in upper and lower respiratory sp ecimens during the acute  phase of infection. The expected result is Negative. Fact Sheet for Patients:  BoilerBrush.com.cyhttps://www.fda.gov/media/136312/download Fact Sheet for Healthcare Providers: https://pope.com/https://www.fda.gov/media/136313/download This test is not yet approved or cleared by the [place] FDA and has been authorized for detection and/or diagnosis of SARS-CoV-2 by FDA under an Emergency Use Authorization (EUA).  This EUA will remain in effect (meaning this test can be used) for the duration of the COVID-19 declaration under Section 564(b)(1) of the Act, 21 U.S.C. section 360bbb-3(b)(1), unless the authorization is terminated or revoked sooner. Performed at [facility], [address] [place], [place] [address]   Wound or Superficial Culture     Status: None   Collection Time: 05/09/19  8:52 PM  Result Value Ref Range Status   Specimen Description   Final    WOUND RIGHT LEG Performed at [facility], [address] [place], [place] [address]    Special Requests   Final    Normal Performed at [facility], [address] [place], [place] [address]    Gram Stain   Final    FEW WBC PRESENT,BOTH PMN AND MONONUCLEAR FEW GRAM POSITIVE COCCI Performed at [facility], [address] [place], [place] [address]    Culture MODERATE STAPHYLOCOCCUS AUREUS  Final   Report Status 05/12/2019 FINAL  Final   Organism ID, Bacteria STAPHYLOCOCCUS AUREUS  Final      Susceptibility   Staphylococcus aureus - MIC*    CIPROFLOXACIN >=8 RESISTANT Resistant     ERYTHROMYCIN >=8 RESISTANT Resistant     GENTAMICIN <=0.5 SENSITIVE Sensitive      OXACILLIN 0.5 SENSITIVE Sensitive     TETRACYCLINE <=1 SENSITIVE Sensitive     VANCOMYCIN 1 SENSITIVE Sensitive     TRIMETH/SULFA <=10 SENSITIVE Sensitive     CLINDAMYCIN <=0.25 SENSITIVE Sensitive  RIFAMPIN <=0.5 SENSITIVE Sensitive     Inducible Clindamycin NEGATIVE Sensitive     * MODERATE STAPHYLOCOCCUS AUREUS  Aerobic/Anaerobic Culture (surgical/deep wound)     Status: None (Preliminary result)   Collection Time: 05/09/19  8:52 PM  Result Value Ref Range Status   Specimen Description   Final    ABSCESS RIGHT LEG Performed at [facility] [address], [place], [place] [address]    Special Requests   Final    Normal Performed at [facility], [place] [address], [place], [place] [address]    Gram Stain   Final    ABUNDANT WBC PRESENT, PREDOMINANTLY PMN MODERATE GRAM POSITIVE COCCI Performed at [facility], [place] [address] [place], [place] [address]    Culture   Final    MODERATE STAPHYLOCOCCUS AUREUS NO ANAEROBES ISOLATED; CULTURE IN PROGRESS FOR 5 DAYS    Report Status PENDING  Incomplete   Organism ID, Bacteria STAPHYLOCOCCUS AUREUS  Final      Susceptibility   Staphylococcus aureus - MIC*    CIPROFLOXACIN >=8 RESISTANT Resistant     ERYTHROMYCIN >=8 RESISTANT Resistant     GENTAMICIN <=0.5 SENSITIVE Sensitive     OXACILLIN 0.5 SENSITIVE Sensitive     TETRACYCLINE <=1 SENSITIVE Sensitive     VANCOMYCIN 1 SENSITIVE Sensitive     TRIMETH/SULFA <=10 SENSITIVE Sensitive     CLINDAMYCIN <=0.25 SENSITIVE Sensitive     RIFAMPIN <=0.5 SENSITIVE Sensitive     Inducible Clindamycin NEGATIVE Sensitive     * MODERATE STAPHYLOCOCCUS AUREUS  MRSA PCR Screening     Status: None   Collection Time: 05/10/19 11:35 PM  Result Value Ref Range Status   MRSA by PCR NEGATIVE NEGATIVE Final    Comment:        The GeneXpert MRSA Assay (FDA approved for NASAL specimens only), is one component of a comprehensive MRSA  colonization surveillance program. It is not intended to diagnose MRSA infection nor to guide or monitor treatment for MRSA infections. Performed at [facility], [place] [address] [place], [place] [address]   Aerobic/Anaerobic Culture (surgical/deep wound)     Status: None (Preliminary result)   Collection Time: 05/11/19  1:50 PM  Result Value Ref Range Status   Specimen Description ABSCESS RIGHT LEG  Final   Special Requests NONE  Final   Gram Stain   Final    ABUNDANT WBC PRESENT, PREDOMINANTLY PMN FEW GRAM POSITIVE COCCI    Culture   Final    FEW STAPHYLOCOCCUS AUREUS SUSCEPTIBILITIES TO FOLLOW Performed at [facility], [place] [address], [place], [place] [address]    Report Status PENDING  Incomplete  Aerobic/Anaerobic Culture (surgical/deep wound)     Status: None (Preliminary result)   Collection Time: 05/11/19  2:29 PM  Result Value Ref Range Status   Specimen Description ABSCESS NECK  Final   Special Requests NONE  Final   Gram Stain   Final    ABUNDANT WBC PRESENT,BOTH PMN AND MONONUCLEAR FEW GRAM POSITIVE COCCI    Culture   Final    FEW STAPHYLOCOCCUS AUREUS SUSCEPTIBILITIES TO FOLLOW Performed at [facility], [place] [address], [place], [place] [address]    Report Status PENDING  Incomplete    Radiology Studies: No results found. Scheduled Meds:  acetaminophen  650 mg Oral Q6H   citalopram  20 mg Oral Daily   enoxaparin (LOVENOX) injection  40 mg Subcutaneous QHS   gabapentin  800 mg Oral TID   metoprolol tartrate  25 mg Oral BID   sodium chloride flush  10-40 mL Intracatheter Q12H   Continuous Infusions:   ceFAZolin (ANCEF) IV 2 g (05/13/19 0540)    LOS: 4 days   Azucena FallenWilliam C Nikiya Starn, DO [facility] PAGER is on AMION  If 7PM-7AM, please contact night-coverage [email or web address] Password [email or web address] 05/13/2019, 8:29 AM

## 2019-05-14 LAB — CBC
HCT: 30.9 % — ABNORMAL LOW (ref 36.0–46.0)
Hemoglobin: 10.2 g/dL — ABNORMAL LOW (ref 12.0–15.0)
MCH: 30.2 pg (ref 26.0–34.0)
MCHC: 33 g/dL (ref 30.0–36.0)
MCV: 91.4 fL (ref 80.0–100.0)
Platelets: 334 10*3/uL (ref 150–400)
RBC: 3.38 MIL/uL — ABNORMAL LOW (ref 3.87–5.11)
RDW: 12.7 % (ref 11.5–15.5)
WBC: 5 10*3/uL (ref 4.0–10.5)
nRBC: 0 % (ref 0.0–0.2)

## 2019-05-14 LAB — BASIC METABOLIC PANEL
Anion gap: 7 (ref 5–15)
BUN: 9 mg/dL (ref 6–20)
CO2: 26 mmol/L (ref 22–32)
Calcium: 8.5 mg/dL — ABNORMAL LOW (ref 8.9–10.3)
Chloride: 107 mmol/L (ref 98–111)
Creatinine, Ser: 0.55 mg/dL (ref 0.44–1.00)
GFR calc Af Amer: >60 mL/min (ref >60)
GFR calc non Af Amer: >60 mL/min (ref >60)
Glucose, Bld: 92 mg/dL (ref 70–99)
Potassium: 3.6 mmol/L (ref 3.5–5.1)
Sodium: 140 mmol/L (ref 135–145)

## 2019-05-14 LAB — CULTURE, BLOOD (ROUTINE X 2)
Culture: NO GROWTH
Culture: NO GROWTH
Special Requests: ADEQUATE

## 2019-05-14 NOTE — Plan of Care (Signed)
  Problem: Pain Managment: Goal: General experience of comfort will improve Outcome: Progressing   Problem: Safety: Goal: Ability to remain free from injury will improve Outcome: Progressing   Problem: Skin Integrity: Goal: Risk for impaired skin integrity will decrease Outcome: Progressing   

## 2019-05-14 NOTE — Progress Notes (Signed)
PROGRESS NOTE    Brianna Randall  VFI:[id number] DOB: 12/03/1971 DOA: 05/09/2019 PCP: [name], [name], NP  Brief Narrative:  Brianna Randall is a 48 y.o. female with a known history of peripheral neuropathy, kidney stones, homelessness presents to the emergency department for evaluation of an insect bite.  Patient was in a usual state of health until 6 days ago she noticed that she had several insect bites after swimming in a river.  The bites were on her right lower leg and right lateral neck.  She developed redness and swelling within the last 24 hours, not associated with any fevers, chills. Patient denies fevers/chills, weakness, dizziness, chest pain, shortness of breath, N/V/C/D, abdominal pain, dysuria/frequency, changes in mental status. Otherwise there has been no change in status. Patient has been taking medication as prescribed and there has been no recent change in medication or diet.  No recent antibiotics.  There has been no recent illness, hospitalizations, travel or sick contacts. In ED patient received Levaquin. Medical admission has been requested for further management of right lower extremity cellulitis with abscess, right neck cellulitis. General surgery and infectious diseases were consulted for further evaluation recommendations.  General surgery is obtaining a CT of the soft tissue of the neck.  General surgery also recommended orthopedic evaluation for her right lower leg and I spoke with Dr. Roda ShuttersXu who will see the patient in consultation but requested patient be transferred to [facility] for incision and drainage in OR.  Assessment & Plan:   Principal Problem:   Soft tissue abscess Active Problems:   Normocytic anemia   Psoriasis   History of nephrolithiasis   Obesity   Neuropathy   Hypertension   Cellulitis and abscess of right lower extremity  Purulent cellulitis of the right lateral neck and right lower leg, POA, improving;  -likely staph aureus per preliminary report -Patient  does not meet sepsis criteria - no fevers/leukocytosis overnight -Admitted Inpatient given severity and need for likely prolonged IV Abx -Continue cefazolin only per ID; Previously on IV Zosyn/vancomycin as well -Pain control with po Tramadol 50 mg q6hprn Moderate Pain and IV Morphine 1 mg q4hprn Severe Pain  -Follow-up cultures - prelim report Staph aureus on RLE culture -S/P I/D 05/11/19 - needs repeat debridement 05/15/19 - will need to be NPO Sunday night  Mild Hypokalemia -replete appropriately, follow am labs  History of Neuropathy/unspecified psychiatric Hx -Continue Gabapentin 800 mg po TID and Citalopram 20 mg po Daily   History of Hypertension -Continue Metoprolol 25mg po BID  Normocytic Anemia, likely anemia of chronic disease/iron deficiency given history -No signs/symptoms of bleeding -Follow am CBC -Patient admits to poor nutrition  Morbid Obesity -Estimated body mass index is 42.98 kg/m as calculated from the following:   Height as of this encounter: 5\' 2" (1.575 m).   Weight as of this encounter: 106.6 kg. -Weight loss and Dietary Counseling given   DVT prophylaxis: Enoxaparin 40 mg sq qHS Code Status: Full code Disposition Plan: Continue as inpatient -needs to require IV antibiotics, repeat surgical evaluation and likely repeat procedure on 05/15/2019 extensive disease as reported by surgical specialist.  Consultants:  Infectious Diseases General Surgery Orthopedic Surgery     Antimicrobials: Anti-infectives (From admission, onward)   Start     Dose/Rate Route Frequency Ordered Stop   05/11/19 1400  ceFAZolin (ANCEF) IVPB 2g/100 mL premix     2 g 200 mL/hr over 30 Minutes Intravenous Every 8 hours 05/11/19 1107     06 /03/20  2000  vancomycin (VANCOCIN) 1,500 mg in sodium chloride 0.9 % 500 mL IVPB  Status:  Discontinued     1,500 mg 250 mL/hr over 120 Minutes Intravenous Every 24 hours 05/10/19 0403 05/11/19 1107   05/10/19 0600  piperacillin-tazobactam  (ZOSYN) IVPB 3.375 g  Status:  Discontinued     3.375 g 12.5 mL/hr over 240 Minutes Intravenous Every 8 hours 05/09/19 2227 05/10/19 1537   05/09/19 2245  vancomycin (VANCOCIN) IVPB 1000 mg/200 mL premix  Status:  Discontinued     1,000 mg 200 mL/hr over 60 Minutes Intravenous  Once 05/09/19 2242 05/09/19 2249   05/09/19 2230  piperacillin-tazobactam (ZOSYN) IVPB 3.375 g     3.375 g 100 mL/hr over 30 Minutes Intravenous  Once 05/09/19 2227 05/10/19 0118   05/09/19 1945  vancomycin (VANCOCIN) 2,000 mg in sodium chloride 0.9 % 500 mL IVPB     2,000 mg 250 mL/hr over 120 Minutes Intravenous  Once 05/09/19 1937 05/09/19 2158   05/09/19 1930  levofloxacin (LEVAQUIN) IVPB 750 mg     750 mg 100 mL/hr over 90 Minutes Intravenous  Once 05/09/19 1926 05/10/19 0036     Subjective: No acute issues or events overnight, patient tolerated procedure quite well, denies any current pain; breath, chest pain, nausea, vomiting, diarrhea, constipation, headache, fevers, chills.  Objective: Vitals:   05/13/19 0616 05/13/19 1414 05/13/19 2122 05/14/19 0605  BP: (!) 152/82 137/89 (!) 151/87 (!) 151/91  Pulse: 74 78 73 74  Resp:   14 16  Temp: 98.5 F (36.9 C) 98.2 F (36.8 C) 98.4 F (36.9 C) 98.4 F (36.9 C)  TempSrc: Oral Oral Oral Oral  SpO2: 100% 100% 99% 100%  Weight:      Height:        Intake/Output Summary (Last 24 hours) at 05/14/2019 1413 Last data filed at 05/14/2019 0900 Gross per 24 hour  Intake 834 ml  Output -  Net 834 ml   Filed Weights   05/09/19 1845  Weight: 106.6 kg   Examination: Physical Exam:  Constitutional: WN/WD morbidly obese Caucasian female in NAD and appears calm but slightly uncomfortable Eyes: Lids and conjunctivae normal, sclerae anicteric  ENMT: External Ears, Nose appear normal. Grossly normal hearing. Mucous membranes are moist.  Neck: Posterolateral bandage clean dry intact Respiratory: Diminished to auscultation bilaterally, no wheezing, rales, rhonchi  or crackles. Normal respiratory effort and patient is not tachypenic. No accessory muscle use.  Cardiovascular: RRR, no murmurs / rubs / gallops. S1 and S2 auscultated. 1+ LE extremity edema.  Abdomen: Soft, non-tender, Distended due to body habitus. No masses palpated. No appreciable hepatosplenomegaly. Bowel sounds positive x4.  GU: Deferred. Musculoskeletal: No clubbing / cyanosis of digits/nails. No joint deformity upper and lower extremities.  Skin: Right Neck and Right Leg bandages clean, dry, intact Neurologic: CN 2-12 grossly intact with no focal deficits. Romberg sign and cerebellar reflexes not assessed.  Psychiatric: Normal judgment and insight. Alert and oriented x 3. Normal mood and appropriate affect.   Data Reviewed: I have personally reviewed following labs and imaging studies  CBC: Recent Labs  Lab 05/09/19 1926 05/10/19 0420 05/12/19 0124 05/13/19 0228 05/14/19 0345  WBC 7.6 8.3 7.3 5.5 5.0  NEUTROABS 5.9  --   --   --   --   HGB 11.0* 10.9* 10.5* 10.6* 10.2*  HCT 34.9* 33.5* 31.4* 32.2* 30.9*  MCV 93.6 94.9 90.0 91.5 91.4  PLT 332 300 319 342 254   Basic Metabolic Panel: Recent Labs  Lab 05/09/19 1926 05/10/19 0420 05/12/19 0124 05/13/19 0228 05/14/19 0345  NA 140 138 140 141 140  K 3.1* 3.6 3.4* 3.4* 3.6  CL 108 107 105 105 107  CO2 GLUCOSE 92 95 120* 96 92  BUN CREATININE 0.71 0.61 0.59 0.65 0.55  CALCIUM 8.6* 8.2* 8.5* 8.6* 8.5*  MG  --  1.8  --   --   --   PHOS  --  3.6  --   --   --    GFR: Estimated Creatinine Clearance: 97.6 mL/min (by C-G formula based on SCr of 0.55 mg/dL). Liver Function Tests: No results for input(s): AST, ALT, ALKPHOS, BILITOT, PROT, ALBUMIN in the last 168 hours. No results for input(s): LIPASE, AMYLASE in the last 168 hours. No results for input(s): AMMONIA in the last 168 hours. Coagulation Profile: No results for input(s): INR, PROTIME in the last 168 hours. Cardiac Enzymes: No  results for input(s): CKTOTAL, CKMB, CKMBINDEX, TROPONINI in the last 168 hours. BNP (last 3 results) No results for input(s): PROBNP in the last 8760 hours. HbA1C: No results for input(s): HGBA1C in the last 72 hours. CBG: No results for input(s): GLUCAP in the last 168 hours. Lipid Profile: No results for input(s): CHOL, HDL, LDLCALC, TRIG, CHOLHDL, LDLDIRECT in the last 72 hours. Thyroid Function Tests: No results for input(s): TSH, T4TOTAL, FREET4, T3FREE, THYROIDAB in the last 72 hours. Anemia Panel: No results for input(s): VITAMINB12, FOLATE, FERRITIN, TIBC, IRON, RETICCTPCT in the last 72 hours. Sepsis Labs: Recent Labs  Lab 05/09/19 1926  LATICACIDVEN 0.6    Recent Results (from the past 240 hour(s))  Blood culture (routine x 2)     Status: None   Collection Time: 05/09/19  7:26 PM  Result Value Ref Range Status   Specimen Description   Final    BLOOD LEFT ANTECUBITAL Performed at [facility], [address], [place], [place] [address]    Special Requests   Final    BOTTLES DRAWN AEROBIC AND ANAEROBIC Blood Culture results Lilja not be optimal due to an excessive volume of blood received in culture bottles Performed at [facility], [address], [place], [place] [address]    Culture   Final    NO GROWTH 5 DAYS Performed at [facility], [address], [place], [place] [address]    Report Status 05/14/2019 FINAL  Final  Blood culture (routine x 2)     Status: None   Collection Time: 05/09/19  7:26 PM  Result Value Ref Range Status   Specimen Description   Final    BLOOD RIGHT ANTECUBITAL Performed at [facility], [address] [place], [place] [address]    Special Requests   Final    BOTTLES DRAWN AEROBIC AND ANAEROBIC Blood Culture adequate volume Performed at [facility], [address] [place], [place] [address]    Culture   Final    NO GROWTH 5 DAYS Performed at  [facility], [address], [place], [place] [address]    Report Status 05/14/2019 FINAL  Final  SARS Coronavirus 2 (CEPHEID - Performed in [facility] hospital lab), Hosp Order     Status: None   Collection Time: 05/09/19  7:26 PM  Result Value Ref Range Status   SARS Coronavirus 2 NEGATIVE NEGATIVE Final    Comment: (NOTE) If result is NEGATIVE SARS-CoV-2 target  nucleic acids are NOT DETECTED. The SARS-CoV-2 RNA is generally detectable in upper and lower  respiratory specimens during the acute phase of infection. The lowest  concentration of SARS-CoV-2 viral copies this assay can detect is 250  copies / mL. A negative result does not preclude SARS-CoV-2 infection  and should not be used as the sole basis for treatment or other  patient management decisions.  A negative result Swantek occur with  improper specimen collection / handling, submission of specimen other  than nasopharyngeal swab, presence of viral mutation(s) within the  areas targeted by this assay, and inadequate number of viral copies  (<250 copies / mL). A negative result must be combined with clinical  observations, patient history, and epidemiological information. If result is POSITIVE SARS-CoV-2 target nucleic acids are DETECTED. The SARS-CoV-2 RNA is generally detectable in upper and lower  respiratory specimens dur ing the acute phase of infection.  Positive  results are indicative of active infection with SARS-CoV-2.  Clinical  correlation with patient history and other diagnostic information is  necessary to determine patient infection status.  Positive results do  not rule out bacterial infection or co-infection with other viruses. If result is PRESUMPTIVE POSTIVE SARS-CoV-2 nucleic acids Rizzolo BE PRESENT.   A presumptive positive result was obtained on the submitted specimen  and confirmed on repeat testing.  While 2019 novel coronavirus  (SARS-CoV-2) nucleic acids Borunda be present in the submitted sample   additional confirmatory testing Sliker be necessary for epidemiological  and / or clinical management purposes  to differentiate between  SARS-CoV-2 and other Sarbecovirus currently known to infect humans.  If clinically indicated additional testing with an alternate test  methodology [phone]) is advised. The SARS-CoV-2 RNA is generally  detectable in upper and lower respiratory sp ecimens during the acute  phase of infection. The expected result is Negative. Fact Sheet for Patients:  BoilerBrush.com.cy Fact Sheet for Healthcare Providers: https://pope.com/ This test is not yet approved or cleared by the [place] FDA and has been authorized for detection and/or diagnosis of SARS-CoV-2 by FDA under an Emergency Use Authorization (EUA).  This EUA will remain in effect (meaning this test can be used) for the duration of the COVID-19 declaration under Section 564(b)(1) of the Act, 21 U.S.C. section 360bbb-3(b)(1), unless the authorization is terminated or revoked sooner. Performed at [facility], [address] [place], [place] [address]   Wound or Superficial Culture     Status: None   Collection Time: 05/09/19  8:52 PM  Result Value Ref Range Status   Specimen Description   Final    WOUND RIGHT LEG Performed at [facility], [address] [place], [place] [address]    Special Requests   Final    Normal Performed at [facility], [address], [place], [place] [address]    Gram Stain   Final    FEW WBC PRESENT,BOTH PMN AND MONONUCLEAR FEW GRAM POSITIVE COCCI Performed at [facility], [address], [place], [place] [address]    Culture MODERATE STAPHYLOCOCCUS AUREUS  Final   Report Status 05/12/2019 FINAL  Final   Organism ID, Bacteria STAPHYLOCOCCUS AUREUS  Final      Susceptibility   Staphylococcus aureus - MIC*    CIPROFLOXACIN >=8 RESISTANT Resistant      ERYTHROMYCIN >=8 RESISTANT Resistant     GENTAMICIN <=0.5 SENSITIVE Sensitive     OXACILLIN 0.5 SENSITIVE Sensitive     TETRACYCLINE <=1  SENSITIVE Sensitive     VANCOMYCIN 1 SENSITIVE Sensitive     TRIMETH/SULFA <=10 SENSITIVE Sensitive     CLINDAMYCIN <=0.25 SENSITIVE Sensitive     RIFAMPIN <=0.5 SENSITIVE Sensitive     Inducible Clindamycin NEGATIVE Sensitive     * MODERATE STAPHYLOCOCCUS AUREUS  Aerobic/Anaerobic Culture (surgical/deep wound)     Status: None (Preliminary result)   Collection Time: 05/09/19  8:52 PM  Result Value Ref Range Status   Specimen Description   Final    ABSCESS RIGHT LEG Performed at [facility], [address] [place], [place] [address]    Special Requests   Final    Normal Performed at [facility], [address] [place], [place] [address]    Gram Stain   Final    ABUNDANT WBC PRESENT, PREDOMINANTLY PMN MODERATE GRAM POSITIVE COCCI Performed at [facility], [address] [place], [place] [address]    Culture   Final    MODERATE STAPHYLOCOCCUS AUREUS NO ANAEROBES ISOLATED; CULTURE IN PROGRESS FOR 5 DAYS    Report Status PENDING  Incomplete   Organism ID, Bacteria STAPHYLOCOCCUS AUREUS  Final      Susceptibility   Staphylococcus aureus - MIC*    CIPROFLOXACIN >=8 RESISTANT Resistant     ERYTHROMYCIN >=8 RESISTANT Resistant     GENTAMICIN <=0.5 SENSITIVE Sensitive     OXACILLIN 0.5 SENSITIVE Sensitive     TETRACYCLINE <=1 SENSITIVE Sensitive     VANCOMYCIN 1 SENSITIVE Sensitive     TRIMETH/SULFA <=10 SENSITIVE Sensitive     CLINDAMYCIN <=0.25 SENSITIVE Sensitive     RIFAMPIN <=0.5 SENSITIVE Sensitive     Inducible Clindamycin NEGATIVE Sensitive     * MODERATE STAPHYLOCOCCUS AUREUS  MRSA PCR Screening     Status: None   Collection Time: 05/10/19 11:35 PM  Result Value Ref Range Status   MRSA by PCR NEGATIVE NEGATIVE Final    Comment:        The GeneXpert MRSA Assay (FDA approved for  NASAL specimens only), is one component of a comprehensive MRSA colonization surveillance program. It is not intended to diagnose MRSA infection nor to guide or monitor treatment for MRSA infections. Performed at [facility], [address] [place], [place] [address]   Aerobic/Anaerobic Culture (surgical/deep wound)     Status: None (Preliminary result)   Collection Time: 05/11/19  1:50 PM  Result Value Ref Range Status   Specimen Description ABSCESS RIGHT LEG  Final   Special Requests NONE  Final   Gram Stain   Final    ABUNDANT WBC PRESENT, PREDOMINANTLY PMN FEW GRAM POSITIVE COCCI Performed at [facility], [address] [place], [place] [address]    Culture   Final    FEW STAPHYLOCOCCUS AUREUS NO ANAEROBES ISOLATED; CULTURE IN PROGRESS FOR 5 DAYS    Report Status PENDING  Incomplete   Organism ID, Bacteria STAPHYLOCOCCUS AUREUS  Final      Susceptibility   Staphylococcus aureus - MIC*    CIPROFLOXACIN >=8 RESISTANT Resistant     ERYTHROMYCIN >=8 RESISTANT Resistant     GENTAMICIN <=0.5 SENSITIVE Sensitive     OXACILLIN <=0.25 SENSITIVE Sensitive     TETRACYCLINE <=1 SENSITIVE Sensitive     VANCOMYCIN 1 SENSITIVE Sensitive     TRIMETH/SULFA <=10 SENSITIVE Sensitive     CLINDAMYCIN <=0.25 SENSITIVE Sensitive     RIFAMPIN <=0.5 SENSITIVE Sensitive     Inducible Clindamycin NEGATIVE Sensitive     *  FEW STAPHYLOCOCCUS AUREUS  Aerobic/Anaerobic Culture (surgical/deep wound)     Status: None (Preliminary result)   Collection Time: 05/11/19  2:29 PM  Result Value Ref Range Status   Specimen Description ABSCESS NECK  Final   Special Requests NONE  Final   Gram Stain   Final    ABUNDANT WBC PRESENT,BOTH PMN AND MONONUCLEAR FEW GRAM POSITIVE COCCI Performed at [facility], [address] [place], [place] [address]    Culture   Final    FEW STAPHYLOCOCCUS AUREUS NO ANAEROBES ISOLATED; CULTURE IN PROGRESS FOR 5 DAYS    Report Status PENDING   Incomplete   Organism ID, Bacteria STAPHYLOCOCCUS AUREUS  Final      Susceptibility   Staphylococcus aureus - MIC*    CIPROFLOXACIN >=8 RESISTANT Resistant     ERYTHROMYCIN >=8 RESISTANT Resistant     GENTAMICIN <=0.5 SENSITIVE Sensitive     OXACILLIN <=0.25 SENSITIVE Sensitive     TETRACYCLINE <=1 SENSITIVE Sensitive     VANCOMYCIN 1 SENSITIVE Sensitive     TRIMETH/SULFA <=10 SENSITIVE Sensitive     CLINDAMYCIN <=0.25 SENSITIVE Sensitive     RIFAMPIN <=0.5 SENSITIVE Sensitive     Inducible Clindamycin NEGATIVE Sensitive     * FEW STAPHYLOCOCCUS AUREUS    Radiology Studies: No results found. Scheduled Meds: . acetaminophen  650 mg Oral Q6H  . citalopram  20 mg Oral Daily  . enoxaparin (LOVENOX) injection  40 mg Subcutaneous QHS  . gabapentin  800 mg Oral TID  . metoprolol tartrate  25 mg Oral BID  . sodium chloride flush  10-40 mL Intracatheter Q12H   Continuous Infusions: .  ceFAZolin (ANCEF) IV 2 g (05/14/19 [id number])    LOS: 5 days   Azucena FallenWilliam C Giani Winther, DO [facility] PAGER is on AMION  If 7PM-7AM, please contact night-coverage [email or web address] Password [email or web address] 05/14/2019, 2:13 PM

## 2019-05-15 ENCOUNTER — Inpatient Hospital Stay (HOSPITAL_COMMUNITY): Payer: Self-pay | Admitting: Anesthesiology

## 2019-05-15 ENCOUNTER — Encounter (HOSPITAL_COMMUNITY)
Admission: EM | Disposition: A | Discharge status: Home / Self Care | Payer: Self-pay | Source: Home / Self Care | Attending: Internal Medicine

## 2019-05-15 HISTORY — PX: I & D EXTREMITY: SHX5045

## 2019-05-15 LAB — CBC
HCT: 31.3 % — ABNORMAL LOW (ref 36.0–46.0)
Hemoglobin: 10.4 g/dL — ABNORMAL LOW (ref 12.0–15.0)
MCH: 30.1 pg (ref 26.0–34.0)
MCHC: 33.2 g/dL (ref 30.0–36.0)
MCV: 90.7 fL (ref 80.0–100.0)
Platelets: 333 10*3/uL (ref 150–400)
RBC: 3.45 MIL/uL — ABNORMAL LOW (ref 3.87–5.11)
RDW: 12.7 % (ref 11.5–15.5)
WBC: 5.6 10*3/uL (ref 4.0–10.5)
nRBC: 0 % (ref 0.0–0.2)

## 2019-05-15 LAB — BASIC METABOLIC PANEL
Anion gap: 6 (ref 5–15)
BUN: 6 mg/dL (ref 6–20)
CO2: 26 mmol/L (ref 22–32)
Calcium: 8.6 mg/dL — ABNORMAL LOW (ref 8.9–10.3)
Chloride: 106 mmol/L (ref 98–111)
Creatinine, Ser: 0.58 mg/dL (ref 0.44–1.00)
GFR calc Af Amer: >60 mL/min (ref >60)
GFR calc non Af Amer: >60 mL/min (ref >60)
Glucose, Bld: 93 mg/dL (ref 70–99)
Potassium: 3.3 mmol/L — ABNORMAL LOW (ref 3.5–5.1)
Sodium: 138 mmol/L (ref 135–145)

## 2019-05-15 LAB — AEROBIC/ANAEROBIC CULTURE W GRAM STAIN (SURGICAL/DEEP WOUND): Special Requests: NORMAL

## 2019-05-15 SURGERY — IRRIGATION AND DEBRIDEMENT EXTREMITY
Anesthesia: General | Laterality: Right

## 2019-05-15 MED ORDER — HYDROCODONE-ACETAMINOPHEN 7.5-325 MG PO TABS
1.0000 | ORAL_TABLET | Freq: Once | ORAL | Status: AC | PRN
  Administered 2019-05-15: 1 via ORAL

## 2019-05-15 MED ORDER — PROMETHAZINE HCL 25 MG/ML IJ SOLN: 6.2500 mg | INTRAMUSCULAR | Status: DC | PRN

## 2019-05-15 MED ORDER — HYDROCODONE-ACETAMINOPHEN 7.5-325 MG PO TABS
ORAL_TABLET | ORAL | Status: AC
  Filled 2019-05-15: qty 1

## 2019-05-15 MED ORDER — ACETAMINOPHEN 10 MG/ML IV SOLN: 1000.0000 mg | Freq: Once | INTRAVENOUS | Status: DC | PRN

## 2019-05-15 MED ORDER — DEXAMETHASONE SODIUM PHOSPHATE 10 MG/ML IJ SOLN
INTRAMUSCULAR | Status: DC | PRN
  Administered 2019-05-15: 5 mg via INTRAVENOUS

## 2019-05-15 MED ORDER — FENTANYL CITRATE (PF) 100 MCG/2ML IJ SOLN
INTRAMUSCULAR | Status: DC | PRN
  Administered 2019-05-15 (×2): 50 ug via INTRAVENOUS

## 2019-05-15 MED ORDER — MEPERIDINE HCL 25 MG/ML IJ SOLN: 6.2500 mg | INTRAMUSCULAR | Status: DC | PRN

## 2019-05-15 MED ORDER — SODIUM CHLORIDE 0.9 % IR SOLN
Status: DC | PRN
  Administered 2019-05-15 (×2): 3000 mL

## 2019-05-15 MED ORDER — FENTANYL CITRATE (PF) 250 MCG/5ML IJ SOLN
INTRAMUSCULAR | Status: AC
  Filled 2019-05-15: qty 5

## 2019-05-15 MED ORDER — MIDAZOLAM HCL 2 MG/2ML IJ SOLN
INTRAMUSCULAR | Status: AC
  Filled 2019-05-15: qty 2

## 2019-05-15 MED ORDER — LACTATED RINGERS IV SOLN
INTRAVENOUS | Status: DC
  Administered 2019-05-15: 09:00:00 via INTRAVENOUS

## 2019-05-15 MED ORDER — PROPOFOL 10 MG/ML IV BOLUS
INTRAVENOUS | Status: DC | PRN
  Administered 2019-05-15: 200 mg via INTRAVENOUS

## 2019-05-15 MED ORDER — ENOXAPARIN SODIUM 40 MG/0.4ML ~~LOC~~ SOLN
40.0000 mg | SUBCUTANEOUS | Status: DC
  Administered 2019-05-16: 40 mg via SUBCUTANEOUS
  Filled 2019-05-15: qty 0.4

## 2019-05-15 MED ORDER — EPHEDRINE SULFATE-NACL 50-0.9 MG/10ML-% IV SOSY
PREFILLED_SYRINGE | INTRAVENOUS | Status: DC | PRN
  Administered 2019-05-15: 5 mg via INTRAVENOUS
  Administered 2019-05-15 (×3): 10 mg via INTRAVENOUS

## 2019-05-15 MED ORDER — HYDROMORPHONE HCL 1 MG/ML IJ SOLN: 0.2500 mg | INTRAMUSCULAR | Status: DC | PRN

## 2019-05-15 MED ORDER — ONDANSETRON HCL 4 MG/2ML IJ SOLN
INTRAMUSCULAR | Status: DC | PRN
  Administered 2019-05-15: 4 mg via INTRAVENOUS

## 2019-05-15 MED ORDER — PROPOFOL 10 MG/ML IV BOLUS
INTRAVENOUS | Status: AC
  Filled 2019-05-15: qty 20

## 2019-05-15 MED ORDER — LIDOCAINE 2% (20 MG/ML) 5 ML SYRINGE
INTRAMUSCULAR | Status: DC | PRN
  Administered 2019-05-15: 100 mg via INTRAVENOUS

## 2019-05-15 MED ORDER — MIDAZOLAM HCL 2 MG/2ML IJ SOLN
INTRAMUSCULAR | Status: DC | PRN
  Administered 2019-05-15: 2 mg via INTRAVENOUS

## 2019-05-15 SURGICAL SUPPLY — 50 items
BANDAGE ACE 3X5.8 VEL STRL LF (GAUZE/BANDAGES/DRESSINGS) IMPLANT
BNDG COHESIVE 4X5 TAN STRL (GAUZE/BANDAGES/DRESSINGS) ×1 IMPLANT
BNDG COHESIVE 6X5 TAN STRL LF (GAUZE/BANDAGES/DRESSINGS) ×4 IMPLANT
BNDG ELASTIC 6X10 VLCR STRL LF (GAUZE/BANDAGES/DRESSINGS) ×2 IMPLANT
BNDG GAUZE ELAST 4 BULKY (GAUZE/BANDAGES/DRESSINGS) ×2 IMPLANT
BNDG GAUZE STRTCH 6 (GAUZE/BANDAGES/DRESSINGS) ×3 IMPLANT
COVER SURGICAL LIGHT HANDLE (MISCELLANEOUS) ×3 IMPLANT
COVER WAND RF STERILE (DRAPES) ×3 IMPLANT
CUFF TOURNIQUET SINGLE 24IN (TOURNIQUET CUFF) IMPLANT
CUFF TOURNIQUET SINGLE 34IN LL (TOURNIQUET CUFF) IMPLANT
CUFF TOURNIQUET SINGLE 44IN (TOURNIQUET CUFF) IMPLANT
DRAPE U-SHAPE 47X51 STRL (DRAPES) ×3 IMPLANT
DURAPREP 26ML APPLICATOR (WOUND CARE) ×1 IMPLANT
ELECT REM PT RETURN 9FT ADLT (ELECTROSURGICAL)
ELECTRODE REM PT RTRN 9FT ADLT (ELECTROSURGICAL) IMPLANT
GAUZE SPONGE 4X4 12PLY STRL (GAUZE/BANDAGES/DRESSINGS) ×4 IMPLANT
GAUZE XEROFORM 5X9 LF (GAUZE/BANDAGES/DRESSINGS) ×3 IMPLANT
GLOVE BIOGEL PI IND STRL 7.0 (GLOVE) ×1 IMPLANT
GLOVE BIOGEL PI INDICATOR 7.0 (GLOVE) ×2
GLOVE ECLIPSE 7.0 STRL STRAW (GLOVE) ×3 IMPLANT
GLOVE SKINSENSE NS SZ7.5 (GLOVE) ×4
GLOVE SKINSENSE STRL SZ7.5 (GLOVE) ×2 IMPLANT
GOWN STRL REIN XL XLG (GOWN DISPOSABLE) ×6 IMPLANT
HANDPIECE INTERPULSE COAX TIP (DISPOSABLE)
KIT BASIN OR (CUSTOM PROCEDURE TRAY) ×3 IMPLANT
KIT TURNOVER KIT B (KITS) ×3 IMPLANT
MANIFOLD NEPTUNE II (INSTRUMENTS) ×3 IMPLANT
PACK ORTHO EXTREMITY (CUSTOM PROCEDURE TRAY) ×3 IMPLANT
PAD ABD 8X10 STRL (GAUZE/BANDAGES/DRESSINGS) ×3 IMPLANT
PAD ARMBOARD 7.5X6 YLW CONV (MISCELLANEOUS) ×6 IMPLANT
PADDING CAST ABS 4INX4YD NS (CAST SUPPLIES) ×2
PADDING CAST ABS COTTON 4X4 ST (CAST SUPPLIES) ×1 IMPLANT
PADDING CAST COTTON 6X4 STRL (CAST SUPPLIES) ×3 IMPLANT
SET HNDPC FAN SPRY TIP SCT (DISPOSABLE) IMPLANT
SOL PREP PROV IODINE SCRUB 4OZ (MISCELLANEOUS) ×2 IMPLANT
SOLUTION BETADINE 4OZ (MISCELLANEOUS) ×2 IMPLANT
SPONGE LAP 18X18 RF (DISPOSABLE) ×3 IMPLANT
STOCKINETTE IMPERVIOUS 9X36 MD (GAUZE/BANDAGES/DRESSINGS) ×3 IMPLANT
SUT ETHILON 2 0 FS 18 (SUTURE) ×1 IMPLANT
SUT ETHILON 2 0 PSLX (SUTURE) IMPLANT
SUT VIC AB 2-0 FS1 27 (SUTURE) ×2 IMPLANT
SWAB CULTURE ESWAB REG 1ML (MISCELLANEOUS) IMPLANT
TOWEL OR 17X24 6PK STRL BLUE (TOWEL DISPOSABLE) ×3 IMPLANT
TOWEL OR 17X26 10 PK STRL BLUE (TOWEL DISPOSABLE) ×3 IMPLANT
TUBE CONNECTING 12'X1/4 (SUCTIONS) ×1
TUBE CONNECTING 12X1/4 (SUCTIONS) ×2 IMPLANT
TUBING TUR DISP (UROLOGICAL SUPPLIES) ×3 IMPLANT
UNDERPAD 30X30 (UNDERPADS AND DIAPERS) ×6 IMPLANT
WATER STERILE IRR 1000ML POUR (IV SOLUTION) ×3 IMPLANT
YANKAUER SUCT BULB TIP NO VENT (SUCTIONS) ×3 IMPLANT

## 2019-05-15 NOTE — Progress Notes (Signed)
Attempted to call report to [phone] for pt to return. Unit director trying to transfer patient due to low bed availability and high patient incoming volume. Unit to request bed transfer for patient. Patient stable in PACU. Will continue to monitor.

## 2019-05-15 NOTE — Transfer of Care (Signed)
Immediate Anesthesia Transfer of Care Note  Patient: [name]  Procedure(s) Performed: REPEAT IRRIGATION AND DEBRIDEMENT OF LEG (Right )  Patient Location: PACU  Anesthesia Type:General  Level of Consciousness: awake, alert  and oriented  Airway & Oxygen Therapy: Patient Spontanous Breathing and Patient connected to nasal cannula oxygen  Post-op Assessment: Report given to RN and Post -op Vital signs reviewed and stable  Post vital signs: Reviewed and stable  Last Vitals:  Vitals Value Taken Time  BP 128/84 05/15/2019 10:15 AM  Temp    Pulse 96 05/15/2019 10:16 AM  Resp 14 05/15/2019 10:16 AM  SpO2 100 % 05/15/2019 10:16 AM  Vitals shown include unvalidated device data.  Last Pain:  Vitals:   05/15/19 0454  TempSrc:   PainSc: 7       Patients Stated Pain Goal: 1 (38/25/05 3976)  Complications: No apparent anesthesia complications

## 2019-05-15 NOTE — H&P (Signed)

## 2019-05-15 NOTE — Progress Notes (Addendum)
Pt to short stay. NPO post midnight maint. Consent signed for surgery.  1300 Pt was in PACU waiting for a room transfer. Pt's belongings was given to PACU tech.

## 2019-05-15 NOTE — Progress Notes (Signed)
[facility] Progress Note  Day of Surgery  Subjective: CC-  Just got back onto the floor after surgery on RLE with ortho this morning. Overall doing well. Leg is sore but she reports little to no pain in the neck. Tolerating dressing changes well. States that her wife will be able to help with dressing changes at home.  Objective: Vital signs in last 24 hours: Temp:  [97.2 F (36.2 C)-98.5 F (36.9 C)] 98 F (36.7 C) (06/08 1514) Pulse Rate:  [70-94] 81 (06/08 1514) Resp:  [14-19] 16 (06/08 1514) BP: (128-158)/(76-93) 146/93 (06/08 1514) SpO2:  [96 %-100 %] 98 % (06/08 1514) Last BM Date: 05/13/19  Intake/Output from previous day: 06/07 0701 - 06/08 0700 In: 1080 [P.O.:1080] Out: -  Intake/Output this shift: Total I/O In: 740 [P.O.:240; I.V.:500] Out: 10 [Blood:10]  PE: Gen:  Alert, NAD, pleasant HEENT: EOM's intact, pupils equal and round Pulm:  effort normal Abd: Soft, NT/ND, no HSM Psych: A&Ox3  Skin: warm and dry Neck: wound is clean with no purulent drainage or fluctuance, induration improving     Lab Results:  Recent Labs    05/14/19 0345 05/15/19 0314  WBC 5.0 5.6  HGB 10.2* 10.4*  HCT 30.9* 31.3*  PLT 334 333   BMET Recent Labs    05/14/19 0345 05/15/19 0314  NA 140 138  K 3.6 3.3*  CL 107 106  CO2 26 26  GLUCOSE 92 93  BUN 9 6  CREATININE 0.55 0.58  CALCIUM 8.5* 8.6*   PT/INR No results for input(s): LABPROT, INR in the last 72 hours. CMP     Component Value Date/Time   NA 138 05/15/2019 0314   NA 139 02/06/2013 0449   K 3.3 (L) 05/15/2019 0314   K 3.1 (L) 02/06/2013 0449   CL 106 05/15/2019 0314   CL 107 02/06/2013 0449   CO2 26 05/15/2019 0314   CO2 27 02/06/2013 0449   GLUCOSE 93 05/15/2019 0314   GLUCOSE 91 02/06/2013 0449   BUN 6 05/15/2019 0314   BUN 9 02/06/2013 0449   CREATININE 0.58 05/15/2019 0314   CREATININE 0.67 02/06/2013 0449   CALCIUM 8.6 (L) 05/15/2019 0314   CALCIUM 8.2 (L) 02/06/2013 0449    PROT 7.4 09/11/2017 2313   PROT 7.2 08/04/2012 0030   ALBUMIN 3.8 09/11/2017 2313   ALBUMIN 3.7 08/04/2012 0030   AST 41 09/11/2017 2313   AST 25 08/04/2012 0030   ALT 36 09/11/2017 2313   ALT 21 08/04/2012 0030   ALKPHOS 144 (H) 09/11/2017 2313   ALKPHOS 65 08/04/2012 0030   BILITOT 0.5 09/11/2017 2313   BILITOT 0.4 08/04/2012 0030   GFRNONAA >60 05/15/2019 0314   GFRNONAA >60 02/06/2013 0449   GFRAA >60 05/15/2019 0314   GFRAA >60 02/06/2013 0449   Lipase     Component Value Date/Time   LIPASE 55 (L) 08/04/2012 0030       Studies/Results: No results found.  Anti-infectives: Anti-infectives (From admission, onward)   Start     Dose/Rate Route Frequency Ordered Stop   05/11/19 1400  ceFAZolin (ANCEF) IVPB 2g/100 mL premix     2 g 200 mL/hr over 30 Minutes Intravenous Every 8 hours 05/11/19 1107     05/10/19 2000  vancomycin (VANCOCIN) 1,500 mg in sodium chloride 0.9 % 500 mL IVPB  Status:  Discontinued     1,500 mg 250 mL/hr over 120 Minutes Intravenous Every 24 hours 05/10/19 0403 05/11/19 1107   05/10/19 0600  piperacillin-tazobactam (ZOSYN) IVPB 3.375 g  Status:  Discontinued     3.375 g 12.5 mL/hr over 240 Minutes Intravenous Every 8 hours 05/09/19 2227 05/10/19 1537   05/09/19 2245  vancomycin (VANCOCIN) IVPB 1000 mg/200 mL premix  Status:  Discontinued     1,000 mg 200 mL/hr over 60 Minutes Intravenous  Once 05/09/19 2242 05/09/19 2249   05/09/19 2230  piperacillin-tazobactam (ZOSYN) IVPB 3.375 g     3.375 g 100 mL/hr over 30 Minutes Intravenous  Once 05/09/19 2227 05/10/19 0118   05/09/19 1945  vancomycin (VANCOCIN) 2,000 mg in sodium chloride 0.9 % 500 mL IVPB     2,000 mg 250 mL/hr over 120 Minutes Intravenous  Once 05/09/19 1937 05/09/19 2158   05/09/19 1930  levofloxacin (LEVAQUIN) IVPB 750 mg     750 mg 100 mL/hr over 90 Minutes Intravenous  Once 05/09/19 1926 05/10/19 0036       Assessment/Plan Homeless Hypertension  Lower extremity  neuropathy  Abscess right posterior neck -S/P excisional debridement R posterior neck abscess with irrigation, Dr. Redmond Pulling, 06/04 - BID wet to dry dressing changes, shower with wound open - cultures show staph aureus   Abscess right lower leg -S/P irrigation and debridement of 2 R lower let abscesses, Dr. Erlinda Hong, 06/04 - S/P Irrigation and debridement of right lower leg wounds including skin, subcutaneous tissue, muscle 85 cm, Dr. Erlinda Hong, 06/08  FEN:reg diet UX:[id number] 6/2-06/04 Ancef 06/04>> per ID,WBC WNL, afebrile BTD:[id number] Follow up: [facility], ortho POC: Pt is homeless and Spouse lives in a tent.  Plan:Neck wound stable, do not recommend any further surgical debridement. Continue wound care as above. Antibiotics per ID. F/u info and wound care instructions on AVS General surgery will sign off, please call with any concerns.    LOS: 6 days    Wellington Hampshire , [facility] Surgery 05/15/2019, 3:30 PM Pager: [phone] Mon-Thurs 7:00 am-4:30 pm Fri 7:00 am -11:30 AM Sat-Sun 7:00 am-11:30 am

## 2019-05-15 NOTE — Op Note (Signed)
   Date of Surgery: 05/15/2019  INDICATIONS: Ms. Brockel is a 48 y.o.-year-old female with a right leg abscesses and resultant wounds;  The patient did consent to the procedure after discussion of the risks and benefits.  PREOPERATIVE DIAGNOSIS: Right lower leg wounds x 2  POSTOPERATIVE DIAGNOSIS: Same.  PROCEDURE: Irrigation and debridement of right lower leg wounds including skin, subcutaneous tissue, muscle 85 cm  SURGEON: N. Eduard Roux, M.D.  ASSIST: Ciro Backer [place], [place]; necessary for the timely completion of procedure and due to complexity of procedure.  ANESTHESIA:  general  IV FLUIDS AND URINE: See anesthesia.  ESTIMATED BLOOD LOSS: Minimal mL.  IMPLANTS: None  DRAINS: None  COMPLICATIONS: see description of procedure.  DESCRIPTION OF PROCEDURE: The patient was brought to the operating room and placed supine on the operating table.  The patient had been signed prior to the procedure and this was documented. The patient had the anesthesia placed by the anesthesiologist.  A time-out was performed to confirm that this was the correct patient, site, side and location. The patient did receive antibiotics prior to the incision and was re-dosed during the procedure as needed at indicated intervals.  The patient had the operative extremity prepped and draped in the standard surgical fashion.    Examination of the wound showed healthy red viable tissue without any evidence of continued infection or necrosis.  With a curette and rondure I performed sharp excisional debridement of the right lower leg wounds which included the skin, subcutaneous tissue, muscle.  There was some mild undermining and tunneling of the medial wound.  After debridement this was then thoroughly irrigated with normal saline.  The wounds were then packed with wet-to-dry dressings.  Patient tolerated procedure well had no any complications.  POSTOPERATIVE PLAN: Patient will need twice a day wet-to-dry dressings to  the wounds.  I will need to see the patient back in the office in 1 week for wound check.  Azucena Cecil, MD [place] 10:03 AM

## 2019-05-15 NOTE — Discharge Instructions (Signed)
Neck wound care: -Change dressing twice daily. Pack wound with saline dampened gauze and apply dry gauze on top. Secure with tape. -Ok to shower with wound open (ie remove all packing/gauze, shower and let warm/soapy water cleanse wound, repack wound after shower)

## 2019-05-15 NOTE — Anesthesia Preprocedure Evaluation (Signed)
Anesthesia Evaluation  Patient identified by MRN, date of birth, ID band Patient awake    Reviewed: Allergy & Precautions, NPO status , Patient's Chart, lab work & pertinent test results  History of Anesthesia Complications Negative for: history of anesthetic complications  Airway Mallampati: II  TM Distance: >3 FB Neck ROM: Full    Dental no notable dental hx. (+) Teeth Intact, Edentulous Upper   Pulmonary neg pulmonary ROS,    Pulmonary exam normal        Cardiovascular hypertension, Normal cardiovascular exam     Neuro/Psych negative neurological ROS     GI/Hepatic negative GI ROS, Neg liver ROS,   Endo/Other  Morbid obesity  Renal/GU negative Renal ROS     Musculoskeletal Right leg abscess   Abdominal   Peds  Hematology  (+) Blood dyscrasia, anemia , Hgb 10.9   Anesthesia Other Findings Day of surgery medications reviewed with the patient.  Reproductive/Obstetrics                             Lab Results  Component Value Date   CREATININE 0.58 05/15/2019   BUN 6 05/15/2019   NA 138 05/15/2019   K 3.3 (L) 05/15/2019   CL 106 05/15/2019   CO2 26 05/15/2019    Lab Results  Component Value Date   WBC 5.6 05/15/2019   HGB 10.4 (L) 05/15/2019   HCT 31.3 (L) 05/15/2019   MCV 90.7 05/15/2019   PLT 333 05/15/2019    Anesthesia Physical  Anesthesia Plan  ASA: III  Anesthesia Plan: General   Post-op Pain Management:    Induction: Intravenous  PONV Risk Score and Plan: 4 or greater and Treatment [place] vary due to age or medical condition, Ondansetron, Dexamethasone and Midazolam  Airway Management Planned: LMA  Additional Equipment:   Intra-op Plan:   Post-operative Plan:   Informed Consent: I have reviewed the patients History and Physical, chart, labs and discussed the procedure including the risks, benefits and alternatives for the proposed anesthesia with the  patient or authorized representative who has indicated his/her understanding and acceptance.     Dental advisory given  Plan Discussed with: CRNA  Anesthesia Plan Comments:         Anesthesia Quick Evaluation

## 2019-05-15 NOTE — Progress Notes (Signed)
PROGRESS NOTE    Denny PeonKim M Keim  OZH:[id number] DOB: 09/02/1971 DOA: 05/09/2019 PCP: [name], [name], NP  Brief Narrative:  Brianna Randall is a 48 y.o. female with a known history of peripheral neuropathy, kidney stones, homelessness presents to the emergency department for evaluation of an insect bite.  Patient was in a usual state of health until 6 days ago she noticed that she had several insect bites after swimming in a river.  The bites were on her right lower leg and right lateral neck.  She developed redness and swelling within the last 24 hours, not associated with any fevers, chills. Patient denies fevers/chills, weakness, dizziness, chest pain, shortness of breath, N/V/C/D, abdominal pain, dysuria/frequency, changes in mental status. Otherwise there has been no change in status. Patient has been taking medication as prescribed and there has been no recent change in medication or diet.  No recent antibiotics.  There has been no recent illness, hospitalizations, travel or sick contacts. In ED patient received Levaquin. Medical admission has been requested for further management of right lower extremity cellulitis with abscess, right neck cellulitis. General surgery and infectious diseases were consulted for further evaluation recommendations.  General surgery is obtaining a CT of the soft tissue of the neck.  General surgery also recommended orthopedic evaluation for her right lower leg and I spoke with Dr. Roda ShuttersXu who will see the patient in consultation but requested [facility] for incision and drainage in OR.  Assessment & Plan:   Principal Problem:   Soft tissue abscess Active Problems:   Normocytic anemia   Psoriasis   History of nephrolithiasis   Obesity   Neuropathy   Hypertension   Cellulitis and abscess of right lower extremity  Purulent cellulitis/abscess of the right lateral neck and right lower leg, POA, improving;  -likely staph aureus per preliminary report  -Patient does not meet sepsis criteria - no fevers/leukocytosis overnight -Admitted Inpatient given severity and need for likely prolonged IV Abx -Continue cefazolin only per ID; Previously on IV Zosyn/vancomycin for 48h but these were de-escalated per ID given prelim cultures -Pain control with po Tramadol 50 mg q6hprn Moderate Pain and IV Morphine 1 mg q4hprn Severe Pain -Follow-up cultures - prelim report Staph aureus - ID indicating transition to keflex at DC is reasonable PO option -Neck - S/P excisional debridement R posterior neck abscess with irrigation, Dr. Andrey CampanileWIlson, 06/04 - BID wet to dry dressing changes, shower with wound open - culturesshow staph aureus -RLE S/P irrigation and debridement of 2 R lower let abscesses, Dr. Roda ShuttersXu, 06/04; S/P Irrigation and debridement of right lower leg woundsincluding skin, subcutaneous tissue, muscle 85 cm, Dr. Roda ShuttersXu, 06/08  Mild Hypokalemia, ongoing -replete appropriately, follow am labs  History of Neuropathy/unspecified psychiatric Hx -Continue Gabapentin 800 mg po TID and Citalopram 20 mg po Daily   History of Hypertension -Continue Metoprolol 25mg  po BID  Normocytic Anemia, likely anemia of chronic disease/iron deficiency given history -No signs/symptoms of bleeding -Follow am CBC -Patient admits to poor nutrition  Morbid Obesity -Estimated body mass index is 42.98 kg/m as calculated from the following:   Height as of this encounter: 5\' 2"  (1.575 m).   Weight as of this encounter: 106.6 kg. -Weight loss and Dietary Counseling given   DVT prophylaxis: Enoxaparin 40 mg sq qHS Code Status: Full code Disposition Plan: Continue as inpatient -needs to require IV antibiotics perioperatively. Once able to tolerate PO more appropriately will transition to PO pain and abx for  discharge, likely home within the next 24-48h pending clinical course.  Consultants:  Infectious Diseases General Surgery Orthopedic Surgery     Antimicrobials:  Anti-infectives (From admission, onward)   Start     Dose/Rate Route Frequency Ordered Stop   05/11/19 1400  ceFAZolin (ANCEF) IVPB 2g/100 mL premix     2 g 200 mL/hr over 30 Minutes Intravenous Every 8 hours 05/11/19 1107     05/10/19 2000  vancomycin (VANCOCIN) 1,500 mg in sodium chloride 0.9 % 500 mL IVPB  Status:  Discontinued     1,500 mg 250 mL/hr over 120 Minutes Intravenous Every 24 hours 05/10/19 0403 05/11/19 1107   05/10/19 0600  piperacillin-tazobactam (ZOSYN) IVPB 3.375 g  Status:  Discontinued     3.375 g 12.5 mL/hr over 240 Minutes Intravenous Every 8 hours 05/09/19 2227 05/10/19 1537   05/09/19 2245  vancomycin (VANCOCIN) IVPB 1000 mg/200 mL premix  Status:  Discontinued     1,000 mg 200 mL/hr over 60 Minutes Intravenous  Once 05/09/19 2242 05/09/19 2249   05/09/19 2230  piperacillin-tazobactam (ZOSYN) IVPB 3.375 g     3.375 g 100 mL/hr over 30 Minutes Intravenous  Once 05/09/19 2227 05/10/19 0118   05/09/19 1945  vancomycin (VANCOCIN) 2,000 mg in sodium chloride 0.9 % 500 mL IVPB     2,000 mg 250 mL/hr over 120 Minutes Intravenous  Once 05/09/19 1937 05/09/19 2158   05/09/19 1930  levofloxacin (LEVAQUIN) IVPB 750 mg     750 mg 100 mL/hr over 90 Minutes Intravenous  Once 05/09/19 1926 05/10/19 0036     Subjective: No acute issues or events overnight, patient tolerated repeat procedure quite well late this afternoon, postoperative pain ongoing - continues to require IV abx; otherwise denies breath, chest pain, nausea, vomiting, diarrhea, constipation, headache, fevers, chills.  Objective: Vitals:   05/15/19 1345 05/15/19 1415 05/15/19 1445 05/15/19 1514  BP: (!) 155/89 (!) 151/76 (!) 149/77 (!) 146/93  Pulse: 79 82 85 81  Resp: 16 19 17 16   Temp: 97.7 F (36.5 C)  98.2 F (36.8 C) 98 F (36.7 C)  TempSrc:    Oral  SpO2: 98% 97% 96% 98%  Weight:      Height:        Intake/Output Summary (Last 24 hours) at 05/15/2019 1545 Last data filed at 05/15/2019 1028  Gross per 24 hour  Intake 1100 ml  Output 10 ml  Net 1090 ml   Filed Weights   05/09/19 1845  Weight: 106.6 kg   Examination: Physical Exam:  Constitutional: WN/WD morbidly obese Caucasian female in NAD and appears calm but slightly uncomfortable Eyes: Lids and conjunctivae normal, sclerae anicteric  ENMT: External Ears, Nose appear normal. Grossly normal hearing. Mucous membranes are moist.  Neck: Posterolateral bandage clean dry intact Respiratory: Diminished to auscultation bilaterally, no wheezing, rales, rhonchi or crackles. Normal respiratory effort and patient is not tachypenic. No accessory muscle use.  Cardiovascular: RRR, no murmurs / rubs / gallops. S1 and S2 auscultated. 1+ LE extremity edema.  Abdomen: Soft, non-tender, Distended due to body habitus. No masses palpated. No appreciable hepatosplenomegaly. Bowel sounds positive x4.  GU: Deferred. Musculoskeletal: No clubbing / cyanosis of digits/nails. No joint deformity upper and lower extremities.  Skin: Right Neck and Right Leg bandages clean, dry, intact Neurologic: CN 2-12 grossly intact with no focal deficits. Romberg sign and cerebellar reflexes not assessed.  Psychiatric: Normal judgment and insight. Alert and oriented x 3. Normal mood and appropriate affect.   Data  Reviewed: I have personally reviewed following labs and imaging studies  CBC: Recent Labs  Lab 05/09/19 1926 05/10/19 0420 05/12/19 0124 05/13/19 0228 05/14/19 0345 05/15/19 0314  WBC 7.6 8.3 7.3 5.5 5.0 5.6  NEUTROABS 5.9  --   --   --   --   --   HGB 11.0* 10.9* 10.5* 10.6* 10.2* 10.4*  HCT 34.9* 33.5* 31.4* 32.2* 30.9* 31.3*  MCV 93.6 94.9 90.0 91.5 91.4 90.7  PLT 332 300 319 342 334 128   Basic Metabolic Panel: Recent Labs  Lab 05/10/19 0420 05/12/19 0124 05/13/19 0228 05/14/19 0345 05/15/19 0314  NA 138 140 141 140 138  K 3.6 3.4* 3.4* 3.6 3.3*  CL 107 105 105 107 106  CO2 25 25 26 26 26   GLUCOSE 95 120* 96 92 93  BUN 7 7 10  9 6   CREATININE 0.61 0.59 0.65 0.55 0.58  CALCIUM 8.2* 8.5* 8.6* 8.5* 8.6*  MG 1.8  --   --   --   --   PHOS 3.6  --   --   --   --    GFR: Estimated Creatinine Clearance: 97.6 mL/min (by C-G formula based on SCr of 0.58 mg/dL). Liver Function Tests: No results for input(s): AST, ALT, ALKPHOS, BILITOT, PROT, ALBUMIN in the last 168 hours. No results for input(s): LIPASE, AMYLASE in the last 168 hours. No results for input(s): AMMONIA in the last 168 hours. Coagulation Profile: No results for input(s): INR, PROTIME in the last 168 hours. Cardiac Enzymes: No results for input(s): CKTOTAL, CKMB, CKMBINDEX, TROPONINI in the last 168 hours. BNP (last 3 results) No results for input(s): PROBNP in the last 8760 hours. HbA1C: No results for input(s): HGBA1C in the last 72 hours. CBG: No results for input(s): GLUCAP in the last 168 hours. Lipid Profile: No results for input(s): CHOL, HDL, LDLCALC, TRIG, CHOLHDL, LDLDIRECT in the last 72 hours. Thyroid Function Tests: No results for input(s): TSH, T4TOTAL, FREET4, T3FREE, THYROIDAB in the last 72 hours. Anemia Panel: No results for input(s): VITAMINB12, FOLATE, FERRITIN, TIBC, IRON, RETICCTPCT in the last 72 hours. Sepsis Labs: Recent Labs  Lab 05/09/19 1926  LATICACIDVEN 0.6    Recent Results (from the past 240 hour(s))  Blood culture (routine x 2)     Status: None   Collection Time: 05/09/19  7:26 PM  Result Value Ref Range Status   Specimen Description   Final    BLOOD LEFT ANTECUBITAL Performed at [facility] [address] [place], [place] [address]    Special Requests   Final    BOTTLES DRAWN AEROBIC AND ANAEROBIC Blood Culture results Eberlin not be optimal due to an excessive volume of blood received in culture bottles Performed at [facility] [address], [place], [place] [address]    Culture   Final    NO GROWTH 5 DAYS Performed at [facility], [place] [address],  [place], [place] [address]    Report Status 05/14/2019 FINAL  Final  Blood culture (routine x 2)     Status: None   Collection Time: 05/09/19  7:26 PM  Result Value Ref Range Status   Specimen Description   Final    BLOOD RIGHT ANTECUBITAL Performed at [facility] [address] [place], [place] [address]    Special Requests   Final    BOTTLES DRAWN AEROBIC AND ANAEROBIC Blood Culture adequate volume Performed at [facility]  [address], [place], [place] [address]    Culture   Final    NO GROWTH 5 DAYS Performed at [facility], [address] [place], [place] [address]    Report Status 05/14/2019 FINAL  Final  SARS Coronavirus 2 (CEPHEID - Performed in [facility] hospital lab), Hosp Order     Status: None   Collection Time: 05/09/19  7:26 PM  Result Value Ref Range Status   SARS Coronavirus 2 NEGATIVE NEGATIVE Final    Comment: (NOTE) If result is NEGATIVE SARS-CoV-2 target nucleic acids are NOT DETECTED. The SARS-CoV-2 RNA is generally detectable in upper and lower  respiratory specimens during the acute phase of infection. The lowest  concentration of SARS-CoV-2 viral copies this assay can detect is 250  copies / mL. A negative result does not preclude SARS-CoV-2 infection  and should not be used as the sole basis for treatment or other  patient management decisions.  A negative result Chumley occur with  improper specimen collection / handling, submission of specimen other  than nasopharyngeal swab, presence of viral mutation(s) within the  areas targeted by this assay, and inadequate number of viral copies  (<250 copies / mL). A negative result must be combined with clinical  observations, patient history, and epidemiological information. If result is POSITIVE SARS-CoV-2 target nucleic acids are DETECTED. The SARS-CoV-2 RNA is generally detectable in upper and lower  respiratory specimens dur ing the acute phase of infection.   Positive  results are indicative of active infection with SARS-CoV-2.  Clinical  correlation with patient history and other diagnostic information is  necessary to determine patient infection status.  Positive results do  not rule out bacterial infection or co-infection with other viruses. If result is PRESUMPTIVE POSTIVE SARS-CoV-2 nucleic acids Walck BE PRESENT.   A presumptive positive result was obtained on the submitted specimen  and confirmed on repeat testing.  While 2019 novel coronavirus  (SARS-CoV-2) nucleic acids Rufener be present in the submitted sample  additional confirmatory testing Yousuf be necessary for epidemiological  and / or clinical management purposes  to differentiate between  SARS-CoV-2 and other Sarbecovirus currently known to infect humans.  If clinically indicated additional testing with an alternate test  methodology [phone]) is advised. The SARS-CoV-2 RNA is generally  detectable in upper and lower respiratory sp ecimens during the acute  phase of infection. The expected result is Negative. Fact Sheet for Patients:  BoilerBrush.com.cy Fact Sheet for Healthcare Providers: https://pope.com/ This test is not yet approved or cleared by the [place] FDA and has been authorized for detection and/or diagnosis of SARS-CoV-2 by FDA under an Emergency Use Authorization (EUA).  This EUA will remain in effect (meaning this test can be used) for the duration of the COVID-19 declaration under Section 564(b)(1) of the Act, 21 U.S.C. section 360bbb-3(b)(1), unless the authorization is terminated or revoked sooner. Performed at [facility], [address] [place], [place] [address]   Wound or Superficial Culture     Status: None   Collection Time: 05/09/19  8:52 PM  Result Value Ref Range Status   Specimen Description   Final    WOUND RIGHT LEG Performed at [facility], [address] [place], [place] [address]    Special Requests   Final    Normal Performed at [facility], [address], [place], [place] [address]    Gram Stain   Final    FEW WBC PRESENT,BOTH PMN  AND MONONUCLEAR FEW GRAM POSITIVE COCCI Performed at [facility], [address] [place], [place] [address]    Culture MODERATE STAPHYLOCOCCUS AUREUS  Final   Report Status 05/12/2019 FINAL  Final   Organism ID, Bacteria STAPHYLOCOCCUS AUREUS  Final      Susceptibility   Staphylococcus aureus - MIC*    CIPROFLOXACIN >=8 RESISTANT Resistant     ERYTHROMYCIN >=8 RESISTANT Resistant     GENTAMICIN <=0.5 SENSITIVE Sensitive     OXACILLIN 0.5 SENSITIVE Sensitive     TETRACYCLINE <=1 SENSITIVE Sensitive     VANCOMYCIN 1 SENSITIVE Sensitive     TRIMETH/SULFA <=10 SENSITIVE Sensitive     CLINDAMYCIN <=0.25 SENSITIVE Sensitive     RIFAMPIN <=0.5 SENSITIVE Sensitive     Inducible Clindamycin NEGATIVE Sensitive     * MODERATE STAPHYLOCOCCUS AUREUS  Aerobic/Anaerobic Culture (surgical/deep wound)     Status: None   Collection Time: 05/09/19  8:52 PM  Result Value Ref Range Status   Specimen Description   Final    ABSCESS RIGHT LEG Performed at [facility], [address] [place], [place] [address]    Special Requests   Final    Normal Performed at [facility], [address] [place], [place] [address]    Gram Stain   Final    ABUNDANT WBC PRESENT, PREDOMINANTLY PMN MODERATE GRAM POSITIVE COCCI    Culture   Final    MODERATE STAPHYLOCOCCUS AUREUS NO ANAEROBES ISOLATED Performed at [facility], [address] [place], [place] [address]    Report Status 05/15/2019 FINAL  Final   Organism ID, Bacteria STAPHYLOCOCCUS AUREUS  Final      Susceptibility   Staphylococcus aureus - MIC*    CIPROFLOXACIN >=8 RESISTANT Resistant     ERYTHROMYCIN >=8 RESISTANT Resistant     GENTAMICIN <=0.5 SENSITIVE Sensitive      OXACILLIN 0.5 SENSITIVE Sensitive     TETRACYCLINE <=1 SENSITIVE Sensitive     VANCOMYCIN 1 SENSITIVE Sensitive     TRIMETH/SULFA <=10 SENSITIVE Sensitive     CLINDAMYCIN <=0.25 SENSITIVE Sensitive     RIFAMPIN <=0.5 SENSITIVE Sensitive     Inducible Clindamycin NEGATIVE Sensitive     * MODERATE STAPHYLOCOCCUS AUREUS  MRSA PCR Screening     Status: None   Collection Time: 05/10/19 11:35 PM  Result Value Ref Range Status   MRSA by PCR NEGATIVE NEGATIVE Final    Comment:        The GeneXpert MRSA Assay (FDA approved for NASAL specimens only), is one component of a comprehensive MRSA colonization surveillance program. It is not intended to diagnose MRSA infection nor to guide or monitor treatment for MRSA infections. Performed at [facility], [address], [place], [place] [address]   Aerobic/Anaerobic Culture (surgical/deep wound)     Status: None (Preliminary result)   Collection Time: 05/11/19  1:50 PM  Result Value Ref Range Status   Specimen Description ABSCESS RIGHT LEG  Final   Special Requests NONE  Final   Gram Stain   Final    ABUNDANT WBC PRESENT, PREDOMINANTLY PMN FEW GRAM POSITIVE COCCI Performed at [facility], [address], [place], [place] [address]    Culture   Final    FEW STAPHYLOCOCCUS AUREUS NO ANAEROBES ISOLATED; CULTURE IN PROGRESS FOR 5 DAYS    Report Status PENDING  Incomplete   Organism ID, Bacteria STAPHYLOCOCCUS AUREUS  Final      Susceptibility  Staphylococcus aureus - MIC*    CIPROFLOXACIN >=8 RESISTANT Resistant     ERYTHROMYCIN >=8 RESISTANT Resistant     GENTAMICIN <=0.5 SENSITIVE Sensitive     OXACILLIN <=0.25 SENSITIVE Sensitive     TETRACYCLINE <=1 SENSITIVE Sensitive     VANCOMYCIN 1 SENSITIVE Sensitive     TRIMETH/SULFA <=10 SENSITIVE Sensitive     CLINDAMYCIN <=0.25 SENSITIVE Sensitive     RIFAMPIN <=0.5 SENSITIVE Sensitive     Inducible Clindamycin NEGATIVE Sensitive     * FEW STAPHYLOCOCCUS AUREUS   Aerobic/Anaerobic Culture (surgical/deep wound)     Status: None (Preliminary result)   Collection Time: 05/11/19  2:29 PM  Result Value Ref Range Status   Specimen Description ABSCESS NECK  Final   Special Requests NONE  Final   Gram Stain   Final    ABUNDANT WBC PRESENT,BOTH PMN AND MONONUCLEAR FEW GRAM POSITIVE COCCI Performed at [facility], [address] [place], [place] [address]    Culture   Final    FEW STAPHYLOCOCCUS AUREUS NO ANAEROBES ISOLATED; CULTURE IN PROGRESS FOR 5 DAYS    Report Status PENDING  Incomplete   Organism ID, Bacteria STAPHYLOCOCCUS AUREUS  Final      Susceptibility   Staphylococcus aureus - MIC*    CIPROFLOXACIN >=8 RESISTANT Resistant     ERYTHROMYCIN >=8 RESISTANT Resistant     GENTAMICIN <=0.5 SENSITIVE Sensitive     OXACILLIN <=0.25 SENSITIVE Sensitive     TETRACYCLINE <=1 SENSITIVE Sensitive     VANCOMYCIN 1 SENSITIVE Sensitive     TRIMETH/SULFA <=10 SENSITIVE Sensitive     CLINDAMYCIN <=0.25 SENSITIVE Sensitive     RIFAMPIN <=0.5 SENSITIVE Sensitive     Inducible Clindamycin NEGATIVE Sensitive     * FEW STAPHYLOCOCCUS AUREUS    Radiology Studies: No results found. Scheduled Meds: . acetaminophen  650 mg Oral Q6H  . citalopram  20 mg Oral Daily  . [START ON 05/16/2019] enoxaparin (LOVENOX) injection  40 mg Subcutaneous Q24H  . gabapentin  800 mg Oral TID  . metoprolol tartrate  25 mg Oral BID  . sodium chloride flush  10-40 mL Intracatheter Q12H   Continuous Infusions: .  ceFAZolin (ANCEF) IV 2 g (05/15/19 [id number])  . lactated ringers 10 mL/hr at 05/15/19 [id number]    LOS: 6 days   Azucena FallenWilliam C Shevy Yaney, DO [facility] PAGER is on AMION  If 7PM-7AM, please contact night-coverage [email or web address] Password [email or web address] 05/15/2019, 3:45 PM

## 2019-05-15 NOTE — Anesthesia Postprocedure Evaluation (Signed)
Anesthesia Post Note  Patient: Brianna Randall  Procedure(s) Performed: REPEAT IRRIGATION AND DEBRIDEMENT OF LEG (Right )     Patient location during evaluation: PACU Anesthesia Type: General Level of consciousness: awake and alert Pain management: pain level controlled Vital Signs Assessment: post-procedure vital signs reviewed and stable Respiratory status: spontaneous breathing, nonlabored ventilation, respiratory function stable and patient connected to nasal cannula oxygen Cardiovascular status: blood pressure returned to baseline and stable Postop Assessment: no apparent nausea or vomiting Anesthetic complications: no    Last Vitals:  Vitals:   05/15/19 1245 05/15/19 1315  BP:    Pulse: 70 75  Resp:    Temp:    SpO2: 97% 98%    Last Pain:  Vitals:   05/15/19 1145  TempSrc:   PainSc: 5                  Barnet Glasgow

## 2019-05-15 NOTE — Plan of Care (Signed)
  Problem: Pain Managment: Goal: General experience of comfort will improve Outcome: Progressing   Problem: Safety: Goal: Ability to remain free from injury will improve Outcome: Progressing   Problem: Skin Integrity: Goal: Risk for impaired skin integrity will decrease Outcome: Progressing   

## 2019-05-15 NOTE — Anesthesia Procedure Notes (Signed)
Procedure Name: LMA Insertion Date/Time: 05/15/2019 9:36 AM Performed by: Trinna Post., CRNA Pre-anesthesia Checklist: Patient identified, Emergency Drugs available, Suction available, Patient being monitored and Timeout performed Patient Re-evaluated:Patient Re-evaluated prior to induction Oxygen Delivery Method: Circle system utilized Preoxygenation: Pre-oxygenation with 100% oxygen Induction Type: IV induction LMA: LMA inserted LMA Size: 4.0 Number of attempts: 1 Placement Confirmation: positive ETCO2 and breath sounds checked- equal and bilateral Tube secured with: Tape Dental Injury: Teeth and Oropharynx as per pre-operative assessment

## 2019-05-16 ENCOUNTER — Encounter (HOSPITAL_COMMUNITY): Payer: Self-pay | Admitting: Orthopaedic Surgery

## 2019-05-16 LAB — AEROBIC/ANAEROBIC CULTURE W GRAM STAIN (SURGICAL/DEEP WOUND)

## 2019-05-16 LAB — CBC
HCT: 31 % — ABNORMAL LOW (ref 36.0–46.0)
Hemoglobin: 10.3 g/dL — ABNORMAL LOW (ref 12.0–15.0)
MCH: 30.1 pg (ref 26.0–34.0)
MCHC: 33.2 g/dL (ref 30.0–36.0)
MCV: 90.6 fL (ref 80.0–100.0)
Platelets: 361 10*3/uL (ref 150–400)
RBC: 3.42 MIL/uL — ABNORMAL LOW (ref 3.87–5.11)
RDW: 12.6 % (ref 11.5–15.5)
WBC: 8.5 10*3/uL (ref 4.0–10.5)
nRBC: 0 % (ref 0.0–0.2)

## 2019-05-16 LAB — BASIC METABOLIC PANEL
Anion gap: 8 (ref 5–15)
BUN: 9 mg/dL (ref 6–20)
CO2: 26 mmol/L (ref 22–32)
Calcium: 8.8 mg/dL — ABNORMAL LOW (ref 8.9–10.3)
Chloride: 105 mmol/L (ref 98–111)
Creatinine, Ser: 0.57 mg/dL (ref 0.44–1.00)
GFR calc Af Amer: >60 mL/min (ref >60)
GFR calc non Af Amer: >60 mL/min (ref >60)
Glucose, Bld: 97 mg/dL (ref 70–99)
Potassium: 3.5 mmol/L (ref 3.5–5.1)
Sodium: 139 mmol/L (ref 135–145)

## 2019-05-16 MED ORDER — CEPHALEXIN 500 MG PO CAPS: 500.0000 mg | ORAL_CAPSULE | Freq: Four times a day (QID) | ORAL | 0 refills | Status: AC

## 2019-05-16 MED ORDER — GABAPENTIN 800 MG PO TABS: 800.0000 mg | ORAL_TABLET | Freq: Three times a day (TID) | ORAL | 0 refills | Status: DC

## 2019-05-16 MED ORDER — TRAMADOL HCL 50 MG PO TABS: 50.0000 mg | ORAL_TABLET | Freq: Four times a day (QID) | ORAL | 0 refills | Status: DC | PRN

## 2019-05-16 MED ORDER — CEPHALEXIN 500 MG PO CAPS: 500.0000 mg | ORAL_CAPSULE | Freq: Four times a day (QID) | ORAL | 0 refills | Status: DC

## 2019-05-16 MED ORDER — TRAMADOL HCL 50 MG PO TABS: 50.0000 mg | ORAL_TABLET | Freq: Four times a day (QID) | ORAL | 0 refills | Status: AC | PRN

## 2019-05-16 MED FILL — GABAPENTIN 800 MG TABLET: 800 | 30 days supply | Qty: 90 | Fill #0

## 2019-05-16 MED FILL — CEPHALEXIN 500 MG CAPSULE: 500 | 10 days supply | Qty: 40 | Fill #0

## 2019-05-16 NOTE — TOC Transition Note (Signed)
Transition of Care [facility]) - CM/SW Discharge Note   Patient Details  Name: Brianna Randall MRN: [id number] Date of Birth: March 10, 1971  Transition of [facility] CM/SW Contact:  Bethena Roys, RN Phone Number: 05/16/2019, 10:33 AM   Clinical Narrative:   MATCH completed for medication assistance. Rx's sent to [facility] and medications will be delivered to bedside with zero co pay.  Patient states that she sees Reginal Lutes at the [facility] and she assists patient with medications. Patient states the next appointment is in July. No further needs from CM at this time.      Barriers to Discharge: Barriers Resolved   Patient Goals and CMS Choice Patient states their goals for this hospitalization and ongoing recovery are:: to go home   Choice offered to / list presented to : Patient   Discharge Plan and Services In-house Referral: Clinical Social Work Discharge Planning Services: Follow-up appt scheduled, Medication Assistance, [place] [facility] Post Acute Care Choice: NA               Social Determinants of Health (SDOH) Interventions     Readmission Risk Interventions No flowsheet data found.

## 2019-05-16 NOTE — Progress Notes (Signed)
Brianna Randall to be D/C'd  per MD order. Discussed with the patient and all questions fully answered. Dressing change showed to patients partner, verbalizes understanding.  VSS, Skin clean, dry and intact without evidence of skin break down, no evidence of skin tears noted.  IV catheter discontinued intact. Site without signs and symptoms of complications. Dressing and pressure applied.  An After Visit Summary was printed and given to the patient. Patient received prescription.  D/c education completed with patient/family including follow up instructions, medication list, d/c activities limitations if indicated, with other d/c instructions as indicated by MD - patient able to verbalize understanding, all questions fully answered.  Patient instructed to return to ED, call 911, or call MD for any changes in condition.   Patient to be escorted via [place], and D/C home via private auto.

## 2019-05-16 NOTE — Progress Notes (Signed)
Subjective: 1 Day Post-Op Procedure(s) (LRB): REPEAT IRRIGATION AND DEBRIDEMENT OF LEG (Right) Patient reports pain as mild.  Only complains of pain with dressing changes. intraop cx MSSA and patient on cefazolin.  Feeling good otherwise and ready to go home.  Objective: Vital signs in last 24 hours: Temp:  [97.2 F (36.2 C)-98.6 F (37 C)] 98.6 F (37 C) (06/09 0435) Pulse Rate:  [70-94] 72 (06/09 0435) Resp:  [14-20] 16 (06/09 0435) BP: (128-158)/(76-93) 130/87 (06/09 0435) SpO2:  [96 %-99 %] 96 % (06/09 0435)  Intake/Output from previous day: 06/08 0701 - 06/09 0700 In: 1499.7 [P.O.:800; I.V.:699.7] Out: 11 [Urine:1; Blood:10] Intake/Output this shift: No intake/output data recorded.  Recent Labs    05/14/19 0345 05/15/19 0314 05/16/19 0426  HGB 10.2* 10.4* 10.3*   Recent Labs    05/15/19 0314 05/16/19 0426  WBC 5.6 8.5  RBC 3.45* 3.42*  HCT 31.3* 31.0*  PLT 333 361   Recent Labs    05/15/19 0314 05/16/19 0426  NA 138 139  K 3.3* 3.5  CL 106 105  CO2 26 26  BUN 6 9  CREATININE 0.58 0.57  GLUCOSE 93 97  CALCIUM 8.6* 8.8*   No results for input(s): LABPT, INR in the last 72 hours.  Neurologically intact Neurovascular intact Sensation intact distally Intact pulses distally Dorsiflexion/Plantar flexion intact Compartment soft  No drainage noted to dressing    Assessment/Plan: 1 Day Post-Op Procedure(s) (LRB): REPEAT IRRIGATION AND DEBRIDEMENT OF LEG (Right) Up with therapy  WBAT RLE Continue wet-to-dry dressing changes RLE BID Intra-op cx grew MSSA.  Patient currently on cefazolin per ID.  F/u with Dr. Erlinda Hong in one week for wound check D/c dispo per medicine team       Aundra Dubin 05/16/2019, 7:42 AM

## 2019-05-16 NOTE — Discharge Summary (Signed)
Physician Discharge Summary  Ranisha Allaire Willig ZOX:[id number] DOB: [birth year] DOA: 05/09/2019  PCP: Lavinia Sharps, NP  Admit date: 05/09/2019 Discharge date: 05/16/2019  Admitted From: Home  Disposition: Home   Recommendations for Outpatient Follow-up:  1. Follow up with PCP in 1-2 weeks 2. Please obtain BMP/CBC in one week 3. Please follow up on the following pending results:  Discharge Condition: Stable CODE STATUS: Full Diet recommendation: As tolerated   Brief/Interim Summary: KimMayis a 48 y.o.femalewith a known history of peripheral neuropathy, kidney stones,homelessnesspresents to the emergency department for evaluation of an insect bite. Patient was in a usual state of health until 6 days ago she noticed that she had several insect bites after swimming in a river. The bites were on her right lower leg and right lateral neck. She developed redness and swelling within the last 24 hours, not associated with any fevers, chills. Patient denies fevers/chills, weakness, dizziness, chest pain, shortness of breath, N/V/C/D, abdominal pain, dysuria/frequency, changes in mental status. Otherwise there has been no change in status. Patient has been taking medication as prescribed and there has been no recent change in medication or diet. No recent antibiotics. There has been no recent illness, hospitalizations, travel or sick contacts. In ED patient received Levaquin. Medical admission has been requested for further management ofright lower extremity cellulitis with abscess, right neck cellulitis. General surgery and infectious diseases were consulted for further evaluation recommendations.  General surgery is obtaining a CT of the soft tissue of the neck.  General surgery also recommended orthopedic evaluation for her right lower leg and I spoke with Dr. Roda Shutters who will see the patient in consultation but requested patient be transferred to [facility] for incision and drainage in OR.  Patient admitted  as above with neck and right lower extremity abscesses requiring multiple procedures, I&D, debridement - created insight recommendations from ID, Ortho, and general surgery. Patient now post-op doing quite well, tolerating p.o., ambulating without difficulty.  Otherwise stable and agreeable for discharge.  Patient need close follow-up with surgery as scheduled within 2 weeks.  Lengthy discussion with ID over the past few days, will transition patient to p.o. Keflex for a total course of 14 days remarkable for staph aureus. For ongoing wound care and evaluation, of note patient previously was homeless there was concern at discharge for being her postoperative wounds clean however patient's wife has secured a home in which they can stay over the next few weeks while patient's wounds are healing.   Discharge Diagnoses:  Principal Problem:   Soft tissue abscess Active Problems:   Normocytic anemia   Psoriasis   History of nephrolithiasis   Obesity   Neuropathy   Hypertension   Cellulitis and abscess of right lower extremity  Purulent cellulitis/abscess of the right lateral neck and right lower leg, POA, improving;  -Staph aureus per preliminary report -Patient does not meet sepsis criteria - no fevers/leukocytosis overnight -Admitted Inpatient given severity and need for likely prolonged IV Abx -Discontinue cefazolin per ID and transition to keflex; Previously on IV Zosyn/vancomycin for 48h but these were de-escalated per ID given prelim cultures -Pain control with po Tramadol 50 mg q6hprn Moderate Pain and IV Morphine 1 mg q4hprn Severe Pain -Neck - S/P excisional debridement R posterior neck abscess with irrigation, Dr. Andrey Campanile, 06/04 - BID wet to dry dressing changes,shower with wound open - culturesshow staph aureus -RLE S/P irrigation and debridement of 2 R lower let abscesses, Dr. Roda Shutters, 06/04; S/PIrrigation and debridement of right  lower leg woundsincluding skin, subcutaneous tissue,  muscle 85 cm, Dr. Erlinda Hong, 06/08  Mild Hypokalemia, stable -replete appropriately, follow am labs  History of Neuropathy/unspecified psychiatric Hx -Continue Gabapentin 800 mg po TID and Citalopram 20 mg po Daily   History of Hypertension -Continue Metoprolol 25mg  po BID  Normocytic Anemia, likely anemia of chronic disease/iron deficiency given history -No signs/symptoms of bleeding -Follow am CBC -Patient admits to poor nutrition  Morbid Obesity -Estimated body mass index is 42.98 kg/m as calculated from the following:   Height as of this encounter: 5\' 2"  (1.575 m).   Weight as of this encounter: 106.6 kg. -Weight loss and Dietary Counseling given   Discharge Instructions  Follow-up with PCP, surgery as scheduled.  Antibiotics until completed.  Continue wound care as described.  Allergies as of 05/16/2019   No Known Allergies     Medication List    STOP taking these medications   oxyCODONE-acetaminophen 5-325 MG tablet Commonly known as:  Roxicet     TAKE these medications   cephALEXin 500 MG capsule Commonly known as:  KEFLEX Take 1 capsule (500 mg total) by mouth 4 (four) times daily for 10 days.   citalopram 20 MG tablet Commonly known as:  CELEXA Take 20 mg by mouth daily.   doxycycline 100 MG capsule Commonly known as:  VIBRAMYCIN Take 1 capsule (100 mg total) by mouth 2 (two) times daily.   gabapentin 800 MG tablet Commonly known as:  NEURONTIN Take 800 mg by mouth 3 (three) times daily.   metoprolol tartrate 25 MG tablet Commonly known as:  LOPRESSOR Take 25 mg by mouth 2 (two) times daily.   traMADol 50 MG tablet Commonly known as:  ULTRAM Take 1 tablet (50 mg total) by mouth every 6 (six) hours as needed for up to 5 days for moderate pain or severe pain.      Follow-up Information    Leandrew Koyanagi, MD In 1 week.   Specialty:  Orthopedic Surgery Why:  For wound re-check Contact information: [place]  [phone] [facility] [place] Follow up on 05/25/2019.   Specialty:  General Surgery Why:  Your appointment is 6/18 at 3:45pm. Please arrive 30 minutes prior to your appointment to check in and fill out paperwork. Bring photo ID and any insurance information. Contact information: [address] [place] [phone]         No Known Allergies  Consultations:  Ortho, Gen Sx, ID as above   Procedures/Studies: Dg Tibia/fibula Right  Result Date: 05/09/2019 CLINICAL DATA:  Pain status post bite. EXAM: RIGHT TIBIA AND FIBULA - 2 VIEW COMPARISON:  None. FINDINGS: There is nonspecific soft tissue swelling about the right lower extremity. There is no displaced fracture. No dislocation. No radiopaque foreign body. Skin thickening is noted in the proximal pretibial region. IMPRESSION: 1. No acute osseous abnormality. 2. Nonspecific soft tissue swelling about the lower extremity. Clinical correlation is recommended. There is some skin thickening involving the pretibial region at the level of the proximal tibia. Findings can be seen with cellulitis. Electronically Signed   By: Constance Holster M.D.   On: 05/09/2019 19:54   Ct Soft Tissue Neck Wo Contrast  Result Date: 05/10/2019 CLINICAL DATA:  Purulent cellulitis of the right lateral neck and right lower leg after being bitten by ants and then swimming in a river per the patient. Patient having drainage  from the neck abscess, bloody and golden tinged fluid. EXAM: CT NECK WITHOUT CONTRAST TECHNIQUE: Multidetector CT imaging of the neck was performed following the standard protocol without intravenous contrast. COMPARISON:  None. FINDINGS: Pharynx and larynx: Normal. No mass or swelling. Salivary glands: No inflammation, mass, or stone. Thyroid: Normal. Lymph nodes: Reactive level 2 and level 5 lymph nodes on the RIGHT. Vascular: Cannot assess for patency or thrombus.  Limited intracranial: Negative Visualized orbits: Negative Mastoids and visualized paranasal sinuses: Negative Skeleton: Negative Upper chest: Negative Other: Extensive cellulitis affects the posterior triangle of the neck. There is skin thickening, with a phlegmon or abscess, 21 x 19 mm, deep to the area superficial drainage. Beyond this, there is extensive stranding of the subcutaneous and deep fat, and the RIGHT posterior nuchal musculature and sternocleidomastoid are enlarged, suggesting myositis IMPRESSION: Extensive cellulitis affecting the RIGHT posterior triangle of the neck, with phlegmon or abscess, 21 x 19 mm deep to the area superficial drainage. Extensive stranding of the subcutaneous and deep fat, and enlargement of the RIGHT posterior nuchal musculature and sternocleidomastoid suggesting myositis. CT of the neck with contrast is recommended for further evaluation to evaluate for presence or absence of abscesses, as well as potential complication internal jugular vein thrombosis. Electronically Signed   By: Elsie Stain M.D.   On: 05/10/2019 13:39   Ct Soft Tissue Neck W Contrast  Result Date: 05/10/2019 CLINICAL DATA:  Right neck abscess. EXAM: CT NECK WITH CONTRAST TECHNIQUE: Multidetector CT imaging of the neck was performed using the standard protocol following the bolus administration of intravenous contrast. CONTRAST:  75mL OMNIPAQUE IOHEXOL 300 MG/ML  SOLN COMPARISON:  CT neck without contrast 05/10/2019 FINDINGS: PHARYNX AND LARYNX: --Nasopharynx: Fossae of Rosenmuller are clear. Normal adenoid tonsils for age. --Oral cavity and oropharynx: The palatine and lingual tonsils are normal. The visible oral cavity and floor of mouth are normal. --Hypopharynx: Normal vallecula and pyriform sinuses. --Larynx: Normal epiglottis and pre-epiglottic space. Normal aryepiglottic and vocal folds. --Retropharyngeal space: No abscess, effusion or lymphadenopathy. SALIVARY GLANDS: --Parotid: No mass lesion  or inflammation. No sialolithiasis or ductal dilatation. --Submandibular: Symmetric without inflammation. No sialolithiasis or ductal dilatation. --Sublingual: Normal. No ranula or other visible lesion of the base of tongue and floor of mouth. THYROID: Normal. LYMPH NODES: Right level 5A nodes measure up to 11 mm. Right level 3 nodes measure up to 10 mm. VASCULAR: No internal jugular vein thrombosis. Major vessels are normal. LIMITED INTRACRANIAL: Normal. VISUALIZED ORBITS: Normal. MASTOIDS AND VISUALIZED PARANASAL SINUSES: No fluid levels or advanced mucosal thickening. No mastoid effusion. SKELETON: No bony spinal canal stenosis. No lytic or blastic lesions. UPPER CHEST: Clear. OTHER: Cutaneous/subcutaneous phlegmon of the right posterior triangle with edema of the adjacent posterior neck muscles and right sternocleidomastoid. No discrete fluid collection. Moderate surrounding skin thickening. IMPRESSION: 1. Redemonstration of cutaneous/subcutaneous phlegmon of the right neck posterior triangle without drainable fluid collection. 2. Edema of the right sternocleidomastoid muscle and right posterior neck musculature suggestive of myositis. 3. No internal jugular vein thrombosis. 4. Reactive right level 3 and 5A lymphadenopathy. Electronically Signed   By: Deatra Robinson M.D.   On: 05/10/2019 22:00    Subjective: No acute issues or events overnight, tolerating p.o. well, pain well controlled, denies chest pain, shortness of breath, nausea, vomiting, diarrhea, constipation, headache, fevers, chills.  See discharge home which is certainly reasonable given patient's resolution of symptoms now status post procedure pain well controlled.   Discharge Exam: Vitals:   05/15/19 2054 05/16/19  0435  BP: 135/80 130/87  Pulse: 93 72  Resp: 20 16  Temp: 98.5 F (36.9 C) 98.6 F (37 C)  SpO2: 98% 96%   Vitals:   05/15/19 1445 05/15/19 1514 05/15/19 2054 05/16/19 0435  BP: (!) 149/77 (!) 146/93 135/80 130/87   Pulse: 85 81 93 72  Resp: 17 16 20 16   Temp: 98.2 F (36.8 C) 98 F (36.7 C) 98.5 F (36.9 C) 98.6 F (37 C)  TempSrc:  Oral Oral Oral  SpO2: 96% 98% 98% 96%  Weight:      Height:        Constitutional: WN/WD morbidly obese Caucasian female in NAD and appears calm but slightly uncomfortable Eyes: Lids and conjunctivae normal, sclerae anicteric  ENMT: External Ears, Nose appear normal. Grossly normal hearing. Mucous membranes are moist.  Neck: Posterolateral bandage clean dry intact Respiratory: Diminished to auscultation bilaterally, no wheezing, rales, rhonchi or crackles. Normal respiratory effort and patient is not tachypenic. No accessory muscle use.  Cardiovascular: RRR, no murmurs / rubs / gallops. S1 and S2 auscultated. 1+ LE extremity edema.  Abdomen: Soft, non-tender, Distended due to body habitus. No masses palpated. No appreciable hepatosplenomegaly. Bowel sounds positive x4.  GU: Deferred. Musculoskeletal: No clubbing / cyanosis of digits/nails. No joint deformity upper and lower extremities.  Skin: Right Neck and Right Leg bandages clean, dry, intact Neurologic: CN 2-12 grossly intact with no focal deficits. Romberg sign and cerebellar reflexes not assessed.    The results of significant diagnostics from this hospitalization (including imaging, microbiology, ancillary and laboratory) are listed below for reference.     Microbiology: Recent Results (from the past 240 hour(s))  Blood culture (routine x 2)     Status: None   Collection Time: 05/09/19  7:26 PM  Result Value Ref Range Status   Specimen Description   Final    BLOOD LEFT ANTECUBITAL Performed at [facility], [address], [place], [place] [address]    Special Requests   Final    BOTTLES DRAWN AEROBIC AND ANAEROBIC Blood Culture results Morton not be optimal due to an excessive volume of blood received in culture bottles Performed at [facility], [address] [place], [place] [address]    Culture   Final    NO GROWTH 5 DAYS Performed at [facility], [address] [place], [place] [address]    Report Status 05/14/2019 FINAL  Final  Blood culture (routine x 2)     Status: None   Collection Time: 05/09/19  7:26 PM  Result Value Ref Range Status   Specimen Description   Final    BLOOD RIGHT ANTECUBITAL Performed at [facility], [address], [place], [place] [address]    Special Requests   Final    BOTTLES DRAWN AEROBIC AND ANAEROBIC Blood Culture adequate volume Performed at [facility], [address], [place], [place] [address]    Culture   Final    NO GROWTH 5 DAYS Performed at [facility], [address] [place], [place] [address]    Report Status 05/14/2019 FINAL  Final  SARS Coronavirus 2 (CEPHEID - Performed in [facility] hospital lab), Hosp Order     Status: None   Collection Time: 05/09/19  7:26 PM  Result Value Ref Range Status   SARS Coronavirus 2 NEGATIVE NEGATIVE Final    Comment: (NOTE) If result is NEGATIVE SARS-CoV-2 target nucleic acids are NOT DETECTED.  The SARS-CoV-2 RNA is generally detectable in upper and lower  respiratory specimens during the acute phase of infection. The lowest  concentration of SARS-CoV-2 viral copies this assay can detect is 250  copies / mL. A negative result does not preclude SARS-CoV-2 infection  and should not be used as the sole basis for treatment or other  patient management decisions.  A negative result [place] occur with  improper specimen collection / handling, submission of specimen other  than nasopharyngeal swab, presence of viral mutation(s) within the  areas targeted by this assay, and inadequate number of viral copies  (<250 copies / mL). A negative result must be combined with clinical  observations, patient history, and epidemiological information. If result is POSITIVE SARS-CoV-2 target nucleic acids are  DETECTED. The SARS-CoV-2 RNA is generally detectable in upper and lower  respiratory specimens dur ing the acute phase of infection.  Positive  results are indicative of active infection with SARS-CoV-2.  Clinical  correlation with patient history and other diagnostic information is  necessary to determine patient infection status.  Positive results do  not rule out bacterial infection or co-infection with other viruses. If result is PRESUMPTIVE POSTIVE SARS-CoV-2 nucleic acids Hildenbrand BE PRESENT.   A presumptive positive result was obtained on the submitted specimen  and confirmed on repeat testing.  While 2019 novel coronavirus  (SARS-CoV-2) nucleic acids Matera be present in the submitted sample  additional confirmatory testing Remer be necessary for epidemiological  and / or clinical management purposes  to differentiate between  SARS-CoV-2 and other Sarbecovirus currently known to infect humans.  If clinically indicated additional testing with an alternate test  methodology [phone]([id number]) is advised. The SARS-CoV-2 RNA is generally  detectable in upper and lower respiratory sp ecimens during the acute  phase of infection. The expected result is Negative. Fact Sheet for Patients:  BoilerBrush.com.cyhttps://www.fda.gov/media/136312/download Fact Sheet for Healthcare Providers: https://pope.com/https://www.fda.gov/media/136313/download This test is not yet approved or cleared by the [place] FDA and has been authorized for detection and/or diagnosis of SARS-CoV-2 by FDA under an Emergency Use Authorization (EUA).  This EUA will remain in effect (meaning this test can be used) for the duration of the COVID-19 declaration under Section 564(b)(1) of the Act, 21 U.S.C. section 360bbb-3(b)(1), unless the authorization is terminated or revoked sooner. Performed at [facility], [address] [place], [place] [address]   Wound or Superficial Culture     Status: None   Collection Time: 05/09/19  8:52 PM   Result Value Ref Range Status   Specimen Description   Final    WOUND RIGHT LEG Performed at [facility], [address] [place], [place] [address]    Special Requests   Final    Normal Performed at [facility], [address] [place], [place] [address]    Gram Stain   Final    FEW WBC PRESENT,BOTH PMN AND MONONUCLEAR FEW GRAM POSITIVE COCCI Performed at [facility], [address] [place], [place] [address]    Culture MODERATE STAPHYLOCOCCUS AUREUS  Final   Report Status 05/12/2019 FINAL  Final   Organism ID, Bacteria STAPHYLOCOCCUS AUREUS  Final      Susceptibility   Staphylococcus aureus - MIC*    CIPROFLOXACIN >=8 RESISTANT Resistant     ERYTHROMYCIN >=8 RESISTANT Resistant     GENTAMICIN <=0.5 SENSITIVE Sensitive     OXACILLIN 0.5 SENSITIVE Sensitive     TETRACYCLINE <=1 SENSITIVE Sensitive  VANCOMYCIN 1 SENSITIVE Sensitive     TRIMETH/SULFA <=10 SENSITIVE Sensitive     CLINDAMYCIN <=0.25 SENSITIVE Sensitive     RIFAMPIN <=0.5 SENSITIVE Sensitive     Inducible Clindamycin NEGATIVE Sensitive     * MODERATE STAPHYLOCOCCUS AUREUS  Aerobic/Anaerobic Culture (surgical/deep wound)     Status: None   Collection Time: 05/09/19  8:52 PM  Result Value Ref Range Status   Specimen Description   Final    ABSCESS RIGHT LEG Performed at [facility], [address], [place], [place] [address]    Special Requests   Final    Normal Performed at [facility], [address], [place], [place] [address]    Gram Stain   Final    ABUNDANT WBC PRESENT, PREDOMINANTLY PMN MODERATE GRAM POSITIVE COCCI    Culture   Final    MODERATE STAPHYLOCOCCUS AUREUS NO ANAEROBES ISOLATED Performed at [facility], [address] [place], [place] [address]    Report Status 05/15/2019 FINAL  Final   Organism ID, Bacteria STAPHYLOCOCCUS AUREUS  Final      Susceptibility   Staphylococcus aureus - MIC*     CIPROFLOXACIN >=8 RESISTANT Resistant     ERYTHROMYCIN >=8 RESISTANT Resistant     GENTAMICIN <=0.5 SENSITIVE Sensitive     OXACILLIN 0.5 SENSITIVE Sensitive     TETRACYCLINE <=1 SENSITIVE Sensitive     VANCOMYCIN 1 SENSITIVE Sensitive     TRIMETH/SULFA <=10 SENSITIVE Sensitive     CLINDAMYCIN <=0.25 SENSITIVE Sensitive     RIFAMPIN <=0.5 SENSITIVE Sensitive     Inducible Clindamycin NEGATIVE Sensitive     * MODERATE STAPHYLOCOCCUS AUREUS  MRSA PCR Screening     Status: None   Collection Time: 05/10/19 11:35 PM  Result Value Ref Range Status   MRSA by PCR NEGATIVE NEGATIVE Final    Comment:        The GeneXpert MRSA Assay (FDA approved for NASAL specimens only), is one component of a comprehensive MRSA colonization surveillance program. It is not intended to diagnose MRSA infection nor to guide or monitor treatment for MRSA infections. Performed at [facility], [address] [place], [place] [address]   Aerobic/Anaerobic Culture (surgical/deep wound)     Status: None (Preliminary result)   Collection Time: 05/11/19  1:50 PM  Result Value Ref Range Status   Specimen Description ABSCESS RIGHT LEG  Final   Special Requests NONE  Final   Gram Stain   Final    ABUNDANT WBC PRESENT, PREDOMINANTLY PMN FEW GRAM POSITIVE COCCI Performed at [facility], [address] [place], [place] [address]    Culture   Final    FEW STAPHYLOCOCCUS AUREUS NO ANAEROBES ISOLATED; CULTURE IN PROGRESS FOR 5 DAYS    Report Status PENDING  Incomplete   Organism ID, Bacteria STAPHYLOCOCCUS AUREUS  Final      Susceptibility   Staphylococcus aureus - MIC*    CIPROFLOXACIN >=8 RESISTANT Resistant     ERYTHROMYCIN >=8 RESISTANT Resistant     GENTAMICIN <=0.5 SENSITIVE Sensitive     OXACILLIN <=0.25 SENSITIVE Sensitive     TETRACYCLINE <=1 SENSITIVE Sensitive     VANCOMYCIN 1 SENSITIVE Sensitive     TRIMETH/SULFA <=10 SENSITIVE Sensitive     CLINDAMYCIN <=0.25 SENSITIVE Sensitive      RIFAMPIN <=0.5 SENSITIVE Sensitive     Inducible Clindamycin NEGATIVE Sensitive     * FEW STAPHYLOCOCCUS AUREUS  Aerobic/Anaerobic Culture (surgical/deep wound)  Status: None (Preliminary result)   Collection Time: 05/11/19  2:29 PM  Result Value Ref Range Status   Specimen Description ABSCESS NECK  Final   Special Requests NONE  Final   Gram Stain   Final    ABUNDANT WBC PRESENT,BOTH PMN AND MONONUCLEAR FEW GRAM POSITIVE COCCI Performed at [facility], [address], [place], [place] [address]    Culture   Final    FEW STAPHYLOCOCCUS AUREUS NO ANAEROBES ISOLATED; CULTURE IN PROGRESS FOR 5 DAYS    Report Status PENDING  Incomplete   Organism ID, Bacteria STAPHYLOCOCCUS AUREUS  Final      Susceptibility   Staphylococcus aureus - MIC*    CIPROFLOXACIN >=8 RESISTANT Resistant     ERYTHROMYCIN >=8 RESISTANT Resistant     GENTAMICIN <=0.5 SENSITIVE Sensitive     OXACILLIN <=0.25 SENSITIVE Sensitive     TETRACYCLINE <=1 SENSITIVE Sensitive     VANCOMYCIN 1 SENSITIVE Sensitive     TRIMETH/SULFA <=10 SENSITIVE Sensitive     CLINDAMYCIN <=0.25 SENSITIVE Sensitive     RIFAMPIN <=0.5 SENSITIVE Sensitive     Inducible Clindamycin NEGATIVE Sensitive     * FEW STAPHYLOCOCCUS AUREUS     Labs: BNP (last 3 results) No results for input(s): BNP in the last 8760 hours. Basic Metabolic Panel: Recent Labs  Lab 05/10/19 0420 05/12/19 0124 05/13/19 0228 05/14/19 0345 05/15/19 0314 05/16/19 0426  NA 138 140 141 140 138 139  K 3.6 3.4* 3.4* 3.6 3.3* 3.5  CL 107 105 105 107 106 105  CO2 25 25 26 26 26 26   GLUCOSE 95 120* 96 92 93 97  BUN 7 7 10 9 6 9   CREATININE 0.61 0.59 0.65 0.55 0.58 0.57  CALCIUM 8.2* 8.5* 8.6* 8.5* 8.6* 8.8*  MG 1.8  --   --   --   --   --   PHOS 3.6  --   --   --   --   --    Liver Function Tests: No results for input(s): AST, ALT, ALKPHOS, BILITOT, PROT, ALBUMIN in the last 168 hours. No results for input(s): LIPASE, AMYLASE in the last 168  hours. No results for input(s): AMMONIA in the last 168 hours. CBC: Recent Labs  Lab 05/09/19 1926  05/12/19 0124 05/13/19 0228 05/14/19 0345 05/15/19 0314 05/16/19 0426  WBC 7.6   < > 7.3 5.5 5.0 5.6 8.5  NEUTROABS 5.9  --   --   --   --   --   --   HGB 11.0*   < > 10.5* 10.6* 10.2* 10.4* 10.3*  HCT 34.9*   < > 31.4* 32.2* 30.9* 31.3* 31.0*  MCV 93.6   < > 90.0 91.5 91.4 90.7 90.6  PLT 332   < > 319 342 334 333 361   < > = values in this interval not displayed.   Cardiac Enzymes: No results for input(s): CKTOTAL, CKMB, CKMBINDEX, TROPONINI in the last 168 hours. BNP: Invalid input(s): POCBNP CBG: No results for input(s): GLUCAP in the last 168 hours. D-Dimer No results for input(s): DDIMER in the last 72 hours. Hgb A1c No results for input(s): HGBA1C in the last 72 hours. Lipid Profile No results for input(s): CHOL, HDL, LDLCALC, TRIG, CHOLHDL, LDLDIRECT in the last 72 hours. Thyroid function studies No results for input(s): TSH, T4TOTAL, T3FREE, THYROIDAB in the last 72 hours.  Invalid input(s): FREET3 Anemia work up No results for input(s): VITAMINB12, FOLATE, FERRITIN, TIBC, IRON, RETICCTPCT in  the last 72 hours. Urinalysis    Component Value Date/Time   COLORURINE YELLOW 09/11/2017 2318   APPEARANCEUR CLEAR 09/11/2017 2318   APPEARANCEUR Cloudy 08/04/2012 0118   LABSPEC 1.015 09/11/2017 2318   LABSPEC 1.031 08/04/2012 0118   PHURINE 7.0 09/11/2017 2318   GLUCOSEU NEGATIVE 09/11/2017 2318   GLUCOSEU Negative 08/04/2012 0118   HGBUR NEGATIVE 09/11/2017 2318   BILIRUBINUR NEGATIVE 09/11/2017 2318   BILIRUBINUR Negative 08/04/2012 0118   KETONESUR NEGATIVE 09/11/2017 2318   PROTEINUR NEGATIVE 09/11/2017 2318   NITRITE NEGATIVE 09/11/2017 2318   LEUKOCYTESUR NEGATIVE 09/11/2017 2318   LEUKOCYTESUR 1+ 08/04/2012 0118   Sepsis Labs Invalid input(s): PROCALCITONIN,  WBC,  LACTICIDVEN Microbiology Recent Results (from the past 240 hour(s))  Blood culture  (routine x 2)     Status: None   Collection Time: 05/09/19  7:26 PM  Result Value Ref Range Status   Specimen Description   Final    BLOOD LEFT ANTECUBITAL Performed at [facility], [address] [place], [place] [address]    Special Requests   Final    BOTTLES DRAWN AEROBIC AND ANAEROBIC Blood Culture results Solorzano not be optimal due to an excessive volume of blood received in culture bottles Performed at [facility], [address] [place], [place] [address]    Culture   Final    NO GROWTH 5 DAYS Performed at [facility], [address] [place], [place] [address]    Report Status 05/14/2019 FINAL  Final  Blood culture (routine x 2)     Status: None   Collection Time: 05/09/19  7:26 PM  Result Value Ref Range Status   Specimen Description   Final    BLOOD RIGHT ANTECUBITAL Performed at [facility], [address] [place], [place] [address]    Special Requests   Final    BOTTLES DRAWN AEROBIC AND ANAEROBIC Blood Culture adequate volume Performed at [facility], [address], [place], [place] [address]    Culture   Final    NO GROWTH 5 DAYS Performed at [facility], [address], [place], [place] [address]    Report Status 05/14/2019 FINAL  Final  SARS Coronavirus 2 (CEPHEID - Performed in [facility] hospital lab), Hosp Order     Status: None   Collection Time: 05/09/19  7:26 PM  Result Value Ref Range Status   SARS Coronavirus 2 NEGATIVE NEGATIVE Final    Comment: (NOTE) If result is NEGATIVE SARS-CoV-2 target nucleic acids are NOT DETECTED. The SARS-CoV-2 RNA is generally detectable in upper and lower  respiratory specimens during the acute phase of infection. The lowest  concentration of SARS-CoV-2 viral copies this assay can detect is 250  copies / mL. A negative result does not preclude SARS-CoV-2 infection  and should not be used as the sole basis for treatment or  other  patient management decisions.  A negative result Oien occur with  improper specimen collection / handling, submission of specimen other  than nasopharyngeal swab, presence of viral mutation(s) within the  areas targeted by this assay, and inadequate number of viral copies  (<250 copies / mL). A negative result must be combined with clinical  observations, patient history, and epidemiological information. If result is POSITIVE SARS-CoV-2 target nucleic acids are DETECTED. The SARS-CoV-2 RNA is generally detectable in upper and lower  respiratory specimens dur ing the acute phase of infection.  Positive  results are indicative  of active infection with SARS-CoV-2.  Clinical  correlation with patient history and other diagnostic information is  necessary to determine patient infection status.  Positive results do  not rule out bacterial infection or co-infection with other viruses. If result is PRESUMPTIVE POSTIVE SARS-CoV-2 nucleic acids Amyx BE PRESENT.   A presumptive positive result was obtained on the submitted specimen  and confirmed on repeat testing.  While 2019 novel coronavirus  (SARS-CoV-2) nucleic acids Sroka be present in the submitted sample  additional confirmatory testing Mirkin be necessary for epidemiological  and / or clinical management purposes  to differentiate between  SARS-CoV-2 and other Sarbecovirus currently known to infect humans.  If clinically indicated additional testing with an alternate test  methodology [phone]([id number]) is advised. The SARS-CoV-2 RNA is generally  detectable in upper and lower respiratory sp ecimens during the acute  phase of infection. The expected result is Negative. Fact Sheet for Patients:  BoilerBrush.com.cyhttps://www.fda.gov/media/136312/download Fact Sheet for Healthcare Providers: https://pope.com/https://www.fda.gov/media/136313/download This test is not yet approved or cleared by the [place] FDA and has been authorized for detection and/or diagnosis of  SARS-CoV-2 by FDA under an Emergency Use Authorization (EUA).  This EUA will remain in effect (meaning this test can be used) for the duration of the COVID-19 declaration under Section 564(b)(1) of the Act, 21 U.S.C. section 360bbb-3(b)(1), unless the authorization is terminated or revoked sooner. Performed at [facility], [address] [place], [place] [address]   Wound or Superficial Culture     Status: None   Collection Time: 05/09/19  8:52 PM  Result Value Ref Range Status   Specimen Description   Final    WOUND RIGHT LEG Performed at [facility], [address] [place], [place] [address]    Special Requests   Final    Normal Performed at [facility], [address] [place], [place] [address]    Gram Stain   Final    FEW WBC PRESENT,BOTH PMN AND MONONUCLEAR FEW GRAM POSITIVE COCCI Performed at [facility], [address] [place], [place] [address]    Culture MODERATE STAPHYLOCOCCUS AUREUS  Final   Report Status 05/12/2019 FINAL  Final   Organism ID, Bacteria STAPHYLOCOCCUS AUREUS  Final      Susceptibility   Staphylococcus aureus - MIC*    CIPROFLOXACIN >=8 RESISTANT Resistant     ERYTHROMYCIN >=8 RESISTANT Resistant     GENTAMICIN <=0.5 SENSITIVE Sensitive     OXACILLIN 0.5 SENSITIVE Sensitive     TETRACYCLINE <=1 SENSITIVE Sensitive     VANCOMYCIN 1 SENSITIVE Sensitive     TRIMETH/SULFA <=10 SENSITIVE Sensitive     CLINDAMYCIN <=0.25 SENSITIVE Sensitive     RIFAMPIN <=0.5 SENSITIVE Sensitive     Inducible Clindamycin NEGATIVE Sensitive     * MODERATE STAPHYLOCOCCUS AUREUS  Aerobic/Anaerobic Culture (surgical/deep wound)     Status: None   Collection Time: 05/09/19  8:52 PM  Result Value Ref Range Status   Specimen Description   Final    ABSCESS RIGHT LEG Performed at [facility], [address] [place], [place] [address]    Special Requests   Final    Normal Performed  at [facility], [address] [place], [place] [address]    Gram Stain   Final    ABUNDANT WBC PRESENT, PREDOMINANTLY PMN MODERATE GRAM POSITIVE COCCI    Culture   Final    MODERATE STAPHYLOCOCCUS AUREUS NO ANAEROBES ISOLATED Performed  at [facility], [address], [place], [place] [address]    Report Status 05/15/2019 FINAL  Final   Organism ID, Bacteria STAPHYLOCOCCUS AUREUS  Final      Susceptibility   Staphylococcus aureus - MIC*    CIPROFLOXACIN >=8 RESISTANT Resistant     ERYTHROMYCIN >=8 RESISTANT Resistant     GENTAMICIN <=0.5 SENSITIVE Sensitive     OXACILLIN 0.5 SENSITIVE Sensitive     TETRACYCLINE <=1 SENSITIVE Sensitive     VANCOMYCIN 1 SENSITIVE Sensitive     TRIMETH/SULFA <=10 SENSITIVE Sensitive     CLINDAMYCIN <=0.25 SENSITIVE Sensitive     RIFAMPIN <=0.5 SENSITIVE Sensitive     Inducible Clindamycin NEGATIVE Sensitive     * MODERATE STAPHYLOCOCCUS AUREUS  MRSA PCR Screening     Status: None   Collection Time: 05/10/19 11:35 PM  Result Value Ref Range Status   MRSA by PCR NEGATIVE NEGATIVE Final    Comment:        The GeneXpert MRSA Assay (FDA approved for NASAL specimens only), is one component of a comprehensive MRSA colonization surveillance program. It is not intended to diagnose MRSA infection nor to guide or monitor treatment for MRSA infections. Performed at [facility], [address] [place], [place] [address]   Aerobic/Anaerobic Culture (surgical/deep wound)     Status: None (Preliminary result)   Collection Time: 05/11/19  1:50 PM  Result Value Ref Range Status   Specimen Description ABSCESS RIGHT LEG  Final   Special Requests NONE  Final   Gram Stain   Final    ABUNDANT WBC PRESENT, PREDOMINANTLY PMN FEW GRAM POSITIVE COCCI Performed at [facility], [address] [place], [place] [address]    Culture   Final    FEW STAPHYLOCOCCUS AUREUS NO ANAEROBES ISOLATED; CULTURE IN PROGRESS FOR 5  DAYS    Report Status PENDING  Incomplete   Organism ID, Bacteria STAPHYLOCOCCUS AUREUS  Final      Susceptibility   Staphylococcus aureus - MIC*    CIPROFLOXACIN >=8 RESISTANT Resistant     ERYTHROMYCIN >=8 RESISTANT Resistant     GENTAMICIN <=0.5 SENSITIVE Sensitive     OXACILLIN <=0.25 SENSITIVE Sensitive     TETRACYCLINE <=1 SENSITIVE Sensitive     VANCOMYCIN 1 SENSITIVE Sensitive     TRIMETH/SULFA <=10 SENSITIVE Sensitive     CLINDAMYCIN <=0.25 SENSITIVE Sensitive     RIFAMPIN <=0.5 SENSITIVE Sensitive     Inducible Clindamycin NEGATIVE Sensitive     * FEW STAPHYLOCOCCUS AUREUS  Aerobic/Anaerobic Culture (surgical/deep wound)     Status: None (Preliminary result)   Collection Time: 05/11/19  2:29 PM  Result Value Ref Range Status   Specimen Description ABSCESS NECK  Final   Special Requests NONE  Final   Gram Stain   Final    ABUNDANT WBC PRESENT,BOTH PMN AND MONONUCLEAR FEW GRAM POSITIVE COCCI Performed at [facility], [address] [place], [place] [address]    Culture   Final    FEW STAPHYLOCOCCUS AUREUS NO ANAEROBES ISOLATED; CULTURE IN PROGRESS FOR 5 DAYS    Report Status PENDING  Incomplete   Organism ID, Bacteria STAPHYLOCOCCUS AUREUS  Final      Susceptibility   Staphylococcus aureus - MIC*    CIPROFLOXACIN >=8 RESISTANT Resistant     ERYTHROMYCIN >=8 RESISTANT Resistant     GENTAMICIN <=0.5 SENSITIVE Sensitive     OXACILLIN <=0.25 SENSITIVE Sensitive     TETRACYCLINE <=1 SENSITIVE  Sensitive     VANCOMYCIN 1 SENSITIVE Sensitive     TRIMETH/SULFA <=10 SENSITIVE Sensitive     CLINDAMYCIN <=0.25 SENSITIVE Sensitive     RIFAMPIN <=0.5 SENSITIVE Sensitive     Inducible Clindamycin NEGATIVE Sensitive     * FEW STAPHYLOCOCCUS AUREUS     Time coordinating discharge: Over 30 minutes  SIGNED:   Azucena FallenWilliam C Kaylanie Capili, DO [facility] 05/16/2019, 8:35 AM Pager   If 7PM-7AM, please contact night-coverage [email or web address] Password [email or web address]

## 2019-05-16 NOTE — Plan of Care (Signed)
  Problem: Pain Managment: Goal: General experience of comfort will improve Outcome: Progressing   Problem: Safety: Goal: Ability to remain free from injury will improve Outcome: Progressing   

## 2019-05-16 NOTE — TOC Initial Note (Signed)
Transition of Care [facility]) - Initial/Assessment Note    Patient Details  Name: Brianna Randall MRN: [id number] Date of Birth: 1971-08-14  [facility] CM/SW Contact:    Andris Flurry Phone Number: [phone] 05/16/2019, 10:26 AM  Clinical Narrative:                 CSW met with pt at bedside, introduced self, role, reason for visit. Pt states that she and her wife Harmon Pier are transiently homeless and generally have lived in a tent for the last several years. Pt states she enjoys being in a tent but as she gets older she thinks she would like to be in more stable housing. She is set up at the [facility] and we discussed case management resources that are available there. Pt planning to dc to a friends home with her wife where she does state she feels safe. Pt wife able to assist with dressing changes and pt is set up with [facility] clinic NP Audrea [place]. Pt has transportation home and knows to wait for her medications prior to dc.   Expected Discharge Plan: Home/Self Care Barriers to Discharge: Barriers Resolved   Patient Goals and CMS Choice Patient states their goals for this hospitalization and ongoing recovery are:: to go home   Choice offered to / list presented to : Patient  Expected Discharge Plan and Services Expected Discharge Plan: Home/Self Care In-house Referral: Clinical Social Work Discharge Planning Services: Follow-up appt scheduled, Medication Assistance, [facility] Post Acute Care Choice: NA Living arrangements for the past 2 months: Homeless Expected Discharge Date: 05/16/19                   Prior Living Arrangements/Services Living arrangements for the past 2 months: Homeless Lives with:: Spouse Patient language and need for interpreter reviewed:: Yes(no needs) Do you feel safe going back to the place where you live?: Yes      Need for Family Participation in Patient Care: Yes (Comment)(dressing changes) Care giver support system in place?: Yes  (comment)(spouse, IRC)   Criminal Activity/Legal Involvement Pertinent to Current Situation/Hospitalization: No - Comment as needed  Activities of Daily Living Home Assistive Devices/Equipment: None ADL Screening (condition at time of admission) Patient's cognitive ability adequate to safely complete daily activities?: Yes Is the patient deaf or have difficulty hearing?: No Does the patient have difficulty seeing, even when wearing glasses/contacts?: No Does the patient have difficulty concentrating, remembering, or making decisions?: No Patient able to express need for assistance with ADLs?: Yes Does the patient have difficulty dressing or bathing?: No Independently performs ADLs?: Yes (appropriate for developmental age) Does the patient have difficulty walking or climbing stairs?: Yes Weakness of Legs: Both Weakness of Arms/Hands: None  Permission Sought/Granted Permission sought to share information with : Family Supports, PCP Permission granted to share information with : Yes, Verbal Permission Granted  Share Information with NAME: Herbert Pun     Permission granted to share info w Relationship: wife  Permission granted to share info w Contact Information: [phone]  Emotional Assessment Appearance:: Appears stated age Attitude/Demeanor/Rapport: Engaged, Gracious Affect (typically observed): Accepting, Adaptable, Appropriate Orientation: : Oriented to Self, Oriented to Place, Oriented to  Time, Oriented to Situation Alcohol / Substance Use: Not Applicable Psych Involvement: No (comment)  Admission diagnosis:  Cellulitis and abscess of neck [L03.221, L02.11] Hypokalemia [E87.6] Cellulitis and abscess of right lower extremity [L03.115, L02.415] Patient Active Problem List   Diagnosis Date Noted  . Cellulitis  and abscess of right lower extremity   . Normocytic anemia 05/10/2019  . Psoriasis 05/10/2019  . History of nephrolithiasis 05/10/2019  . Obesity 05/10/2019  .  Neuropathy 05/10/2019  . Hypertension 05/10/2019  . Soft tissue abscess 05/09/2019   PCP:  Marliss Coots, NP Pharmacy:   [place] [address] [place] [address] Phone: [phone] Fax: [phone]  [place], [place] [address] [place] [address] Phone: [phone] Fax: [phone]     Social Determinants of Health (SDOH) Interventions    Readmission Risk Interventions No flowsheet data found.

## 2019-06-09 ENCOUNTER — Other Ambulatory Visit: Payer: Self-pay

## 2019-06-09 ENCOUNTER — Emergency Department (HOSPITAL_COMMUNITY)
Admission: EM | Admit: 2019-06-09 | Discharge: 2019-06-09 | Disposition: A | Discharge status: Home / Self Care | Payer: Self-pay | Attending: Emergency Medicine | Admitting: Emergency Medicine

## 2019-06-09 ENCOUNTER — Encounter (HOSPITAL_COMMUNITY): Payer: Self-pay | Admitting: Emergency Medicine

## 2019-06-09 DIAGNOSIS — Z79899 Other long term (current) drug therapy: Secondary | ICD-10-CM | POA: Insufficient documentation

## 2019-06-09 DIAGNOSIS — R45851 Suicidal ideations: Secondary | ICD-10-CM | POA: Insufficient documentation

## 2019-06-09 DIAGNOSIS — F329 Major depressive disorder, single episode, unspecified: Secondary | ICD-10-CM

## 2019-06-09 DIAGNOSIS — F332 Major depressive disorder, recurrent severe without psychotic features: Secondary | ICD-10-CM | POA: Insufficient documentation

## 2019-06-09 DIAGNOSIS — I10 Essential (primary) hypertension: Secondary | ICD-10-CM | POA: Insufficient documentation

## 2019-06-09 DIAGNOSIS — F32A Depression, unspecified: Secondary | ICD-10-CM | POA: Diagnosis present

## 2019-06-09 HISTORY — DX: Depression, unspecified: F32.A

## 2019-06-09 HISTORY — DX: Anxiety disorder, unspecified: F41.9

## 2019-06-09 LAB — CBC WITH DIFFERENTIAL/PLATELET
Abs Immature Granulocytes: 0.01 10*3/uL (ref 0.00–0.07)
Basophils Absolute: 0 10*3/uL (ref 0.0–0.1)
Basophils Relative: 0 %
Eosinophils Absolute: 0.1 10*3/uL (ref 0.0–0.5)
Eosinophils Relative: 1 %
HCT: 39.7 % (ref 36.0–46.0)
Hemoglobin: 12.8 g/dL (ref 12.0–15.0)
Immature Granulocytes: 0 %
Lymphocytes Relative: 22 %
Lymphs Abs: 1.1 10*3/uL (ref 0.7–4.0)
MCH: 29.9 pg (ref 26.0–34.0)
MCHC: 32.2 g/dL (ref 30.0–36.0)
MCV: 92.8 fL (ref 80.0–100.0)
Monocytes Absolute: 0.3 10*3/uL (ref 0.1–1.0)
Monocytes Relative: 6 %
Neutro Abs: 3.4 10*3/uL (ref 1.7–7.7)
Neutrophils Relative %: 71 %
Platelets: 229 10*3/uL (ref 150–400)
RBC: 4.28 MIL/uL (ref 3.87–5.11)
RDW: 13.4 % (ref 11.5–15.5)
WBC: 4.8 10*3/uL (ref 4.0–10.5)
nRBC: 0 % (ref 0.0–0.2)

## 2019-06-09 LAB — COMPREHENSIVE METABOLIC PANEL
ALT: 20 U/L (ref 0–44)
AST: 24 U/L (ref 15–41)
Albumin: 4.2 g/dL (ref 3.5–5.0)
Alkaline Phosphatase: 80 U/L (ref 38–126)
Anion gap: 11 (ref 5–15)
BUN: 11 mg/dL (ref 6–20)
CO2: 26 mmol/L (ref 22–32)
Calcium: 9.2 mg/dL (ref 8.9–10.3)
Chloride: 107 mmol/L (ref 98–111)
Creatinine, Ser: 0.66 mg/dL (ref 0.44–1.00)
GFR calc Af Amer: >60 mL/min (ref >60)
GFR calc non Af Amer: >60 mL/min (ref >60)
Glucose, Bld: 120 mg/dL — ABNORMAL HIGH (ref 70–99)
Potassium: 3.6 mmol/L (ref 3.5–5.1)
Sodium: 144 mmol/L (ref 135–145)
Total Bilirubin: 0.3 mg/dL (ref 0.3–1.2)
Total Protein: 7.1 g/dL (ref 6.5–8.1)

## 2019-06-09 LAB — ETHANOL: Alcohol, Ethyl (B): <10 mg/dL (ref <10)

## 2019-06-09 LAB — RAPID URINE DRUG SCREEN, HOSP PERFORMED
Amphetamines: NOT DETECTED
Barbiturates: NOT DETECTED
Benzodiazepines: NOT DETECTED
Cocaine: NOT DETECTED
Opiates: NOT DETECTED
Tetrahydrocannabinol: NOT DETECTED

## 2019-06-09 LAB — I-STAT BETA HCG BLOOD, ED (MC, WL, AP ONLY): I-stat hCG, quantitative: <5 m[IU]/mL (ref <5)

## 2019-06-09 MED ORDER — ALUM & MAG HYDROXIDE-SIMETH 200-200-20 MG/5ML PO SUSP: 30.0000 mL | Freq: Four times a day (QID) | ORAL | Status: DC | PRN

## 2019-06-09 MED ORDER — GABAPENTIN 800 MG PO TABS: 800.0000 mg | ORAL_TABLET | Freq: Three times a day (TID) | ORAL | 0 refills | Status: DC

## 2019-06-09 MED ORDER — ACETAMINOPHEN 325 MG PO TABS: 650.0000 mg | ORAL_TABLET | ORAL | Status: DC | PRN

## 2019-06-09 MED ORDER — HYDROXYZINE PAMOATE 25 MG PO CAPS: 25.0000 mg | ORAL_CAPSULE | Freq: Three times a day (TID) | ORAL | 0 refills | Status: DC | PRN

## 2019-06-09 MED ORDER — METOPROLOL TARTRATE 25 MG PO TABS: 25.0000 mg | ORAL_TABLET | Freq: Two times a day (BID) | ORAL | 0 refills | Status: DC

## 2019-06-09 MED ORDER — GABAPENTIN 400 MG PO CAPS
800.0000 mg | ORAL_CAPSULE | Freq: Three times a day (TID) | ORAL | Status: DC
  Administered 2019-06-09: 800 mg via ORAL
  Filled 2019-06-09: qty 2

## 2019-06-09 MED ORDER — METOPROLOL TARTRATE 25 MG PO TABS
25.0000 mg | ORAL_TABLET | Freq: Two times a day (BID) | ORAL | Status: DC
  Administered 2019-06-09: 25 mg via ORAL
  Filled 2019-06-09: qty 1

## 2019-06-09 MED ORDER — CITALOPRAM HYDROBROMIDE 20 MG PO TABS: 20.0000 mg | ORAL_TABLET | Freq: Every day | ORAL | 0 refills | Status: DC

## 2019-06-09 MED ORDER — ONDANSETRON HCL 4 MG PO TABS: 4.0000 mg | ORAL_TABLET | Freq: Three times a day (TID) | ORAL | Status: DC | PRN

## 2019-06-09 MED ORDER — CITALOPRAM HYDROBROMIDE 10 MG PO TABS
20.0000 mg | ORAL_TABLET | Freq: Every day | ORAL | Status: DC
  Administered 2019-06-09: 20 mg via ORAL
  Filled 2019-06-09: qty 2
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
## 2019-06-09 NOTE — Discharge Instructions (Signed)
For your behavioral health needs, you are advised to follow up with one of the following providers.  Contact them at your earliest opportunity:       [facility], [place] [address]      [phone]      New patients are seen at their walk-in clinic.  Walk-in hours are Monday - Friday from 8:00 am - 12:00 pm, and from 1:00 pm - 2:30 pm.  Walk-in patients are seen on a first come, first served basis, so try to arrive as early as possible for the best chance of being seen the same day.  There is an initial fee of $22.50.           [facility]      [address]      [place], [place] [address]      [phone]      Crisis number: [phone]

## 2019-06-09 NOTE — ED Triage Notes (Signed)
Pt reports being out of medicaitons for several weeks and having increased stressors related to spouse being arrested. Pt reports feeling suicidal but no plan.
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
## 2019-06-09 NOTE — BH Assessment (Signed)
[facility] Assessment Progress Note  Per Buford Dresser, DO, this pt does not require psychiatric hospitalization at this time.  Pt is to be discharged from [facility] with recommendation to follow up with either [facility] or with [facility].  These referrals have been included in pt's discharge instructions.  Pt's nurse has been notified.  Jalene Mullet, [place] Triage Specialist [phone]

## 2019-06-09 NOTE — ED Notes (Signed)
Bed: WTR6 Expected date:  Expected time:  Means of arrival:  Comments: 
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
## 2019-06-09 NOTE — BH Assessment (Addendum)
[facility] Assessment Progress Note  Case was staffed with Romilda Garret NP who recommended patient be discharged this date and follow up with OP resources.

## 2019-09-09 ENCOUNTER — Emergency Department (HOSPITAL_COMMUNITY)
Admission: EM | Admit: 2019-09-09 | Discharge: 2019-09-10 | Disposition: A | Discharge status: Other Acute Inpatient Hospital | Payer: Self-pay | Attending: Emergency Medicine | Admitting: Emergency Medicine

## 2019-09-09 DIAGNOSIS — I776 Arteritis, unspecified: Secondary | ICD-10-CM

## 2019-09-09 DIAGNOSIS — Z79899 Other long term (current) drug therapy: Secondary | ICD-10-CM | POA: Insufficient documentation

## 2019-09-09 DIAGNOSIS — I10 Essential (primary) hypertension: Secondary | ICD-10-CM | POA: Insufficient documentation

## 2019-09-09 DIAGNOSIS — N179 Acute kidney failure, unspecified: Secondary | ICD-10-CM

## 2019-09-09 DIAGNOSIS — L959 Vasculitis limited to the skin, unspecified: Secondary | ICD-10-CM | POA: Insufficient documentation

## 2019-09-09 DIAGNOSIS — R21 Rash and other nonspecific skin eruption: Secondary | ICD-10-CM

## 2019-09-09 DIAGNOSIS — Z20828 Contact with and (suspected) exposure to other viral communicable diseases: Secondary | ICD-10-CM | POA: Insufficient documentation

## 2019-09-09 LAB — CBC WITH DIFFERENTIAL/PLATELET
Abs Immature Granulocytes: 0.08 10*3/uL — ABNORMAL HIGH (ref 0.00–0.07)
Basophils Absolute: 0 10*3/uL (ref 0.0–0.1)
Basophils Relative: 0 %
Eosinophils Absolute: 0.1 10*3/uL (ref 0.0–0.5)
Eosinophils Relative: 1 %
HCT: 41.5 % (ref 36.0–46.0)
Hemoglobin: 14 g/dL (ref 12.0–15.0)
Immature Granulocytes: 1 %
Lymphocytes Relative: 13 %
Lymphs Abs: 1.2 10*3/uL (ref 0.7–4.0)
MCH: 31.1 pg (ref 26.0–34.0)
MCHC: 33.7 g/dL (ref 30.0–36.0)
MCV: 92.2 fL (ref 80.0–100.0)
Monocytes Absolute: 0.5 10*3/uL (ref 0.1–1.0)
Monocytes Relative: 5 %
Neutro Abs: 7.1 10*3/uL (ref 1.7–7.7)
Neutrophils Relative %: 80 %
Platelets: 374 10*3/uL (ref 150–400)
RBC: 4.5 MIL/uL (ref 3.87–5.11)
RDW: 13.4 % (ref 11.5–15.5)
WBC: 9 10*3/uL (ref 4.0–10.5)
nRBC: 0 % (ref 0.0–0.2)

## 2019-09-09 LAB — COMPREHENSIVE METABOLIC PANEL
ALT: 19 U/L (ref 0–44)
AST: 27 U/L (ref 15–41)
Albumin: 3.5 g/dL (ref 3.5–5.0)
Alkaline Phosphatase: 91 U/L (ref 38–126)
Anion gap: 12 (ref 5–15)
BUN: 15 mg/dL (ref 6–20)
CO2: 27 mmol/L (ref 22–32)
Calcium: 9.1 mg/dL (ref 8.9–10.3)
Chloride: 98 mmol/L (ref 98–111)
Creatinine, Ser: 1.4 mg/dL — ABNORMAL HIGH (ref 0.44–1.00)
GFR calc Af Amer: 51 mL/min — ABNORMAL LOW (ref >60)
GFR calc non Af Amer: 44 mL/min — ABNORMAL LOW (ref >60)
Glucose, Bld: 110 mg/dL — ABNORMAL HIGH (ref 70–99)
Potassium: 3.6 mmol/L (ref 3.5–5.1)
Sodium: 137 mmol/L (ref 135–145)
Total Bilirubin: 0.1 mg/dL — ABNORMAL LOW (ref 0.3–1.2)
Total Protein: 7.6 g/dL (ref 6.5–8.1)

## 2019-09-09 LAB — C-REACTIVE PROTEIN: CRP: 0.8 mg/dL (ref <1.0)

## 2019-09-09 LAB — SEDIMENTATION RATE: Sed Rate: 28 mm/hr — ABNORMAL HIGH (ref 0–22)

## 2019-09-09 LAB — HCG, QUANTITATIVE, PREGNANCY: hCG, Beta Chain, Quant, S: 1 m[IU]/mL (ref <5)

## 2019-09-09 LAB — LACTIC ACID, PLASMA
Lactic Acid, Venous: 1.6 mmol/L (ref 0.5–1.9)
Lactic Acid, Venous: 1.3 mmol/L (ref 0.5–1.9)

## 2019-09-09 MED ORDER — SODIUM CHLORIDE 0.9 % IV BOLUS
500.0000 mL | Freq: Once | INTRAVENOUS | Status: AC
  Administered 2019-09-09: 500 mL via INTRAVENOUS

## 2019-09-09 MED ORDER — MORPHINE SULFATE (PF) 2 MG/ML IV SOLN
2.0000 mg | Freq: Once | INTRAVENOUS | Status: AC
  Administered 2019-09-09: 2 mg via INTRAVENOUS
  Filled 2019-09-09: qty 1

## 2019-09-09 NOTE — ED Provider Notes (Signed)
[facility] EMERGENCY DEPARTMENT Provider Note   CSN: [id number] Arrival date & time: 09/09/19  1817     History   Chief Complaint Chief Complaint  Patient presents with  . Rash    HPI Brianna Randall is a 48 y.o. female who presents today for evaluation of a rash.  She reports that the rash started on Wednesday night on her right leg.  She went to her primary care doctor on Thursday and was started on doxycycline as she has a history of multiple abscesses.  She reports that since then the rash has spread into her left lower extremity, abdomen, back, and arms.  She denies any fevers.  She states that the lesions are not itchy rather they feel like a burning sensation.  She reports that she is otherwise well.  Denies nausea vomiting or diarrhea.  She has not tried any interventions other than doxycycline prior to arrival.     HPI  Past Medical History:  Diagnosis Date  . Anxiety   . Depression   . Kidney stones     Patient Active Problem List   Diagnosis Date Noted  . Depression 06/09/2019  . Cellulitis and abscess of right lower extremity   . Normocytic anemia 05/10/2019  . Psoriasis 05/10/2019  . History of nephrolithiasis 05/10/2019  . Obesity 05/10/2019  . Neuropathy 05/10/2019  . Hypertension 05/10/2019  . Soft tissue abscess 05/09/2019    Past Surgical History:  Procedure Laterality Date  . I&D EXTREMITY Right 05/11/2019   Procedure: IRRIGATION AND DEBRIDEMENT LEG AND NECK ABSCESS;  Surgeon: Xu, Naiping M, MD;  [facility];  Service: Orthopedics;  Laterality: Right;  . I&D EXTREMITY Right 05/15/2019   Procedure: REPEAT IRRIGATION AND DEBRIDEMENT OF LEG;  Surgeon: Xu, Naiping M, MD;  [facility];  Service: Orthopedics;  Laterality: Right;  . INCISION AND DRAINAGE OF WOUND  05/11/2019   Procedure: Irrigation And Debridement Wound;  Surgeon: Xu, Naiping M, MD;  [facility] OR;  Service: Orthopedics;;     OB History   No obstetric history on file.       Home Medications    Prior to Admission medications   Medication Sig Start Date End Date Taking? Authorizing Provider  citalopram (CELEXA) 20 MG tablet Take 1 tablet (20 mg total) by mouth daily. 06/09/19   Parks, Laurie Britton, NP  cyclobenzaprine (FLEXERIL) 5 MG tablet Take 5 mg by mouth 3 (three) times daily as needed for muscle spasms.    [provider]  gabapentin (NEURONTIN) 800 MG tablet Take 1 tablet (800 mg total) by mouth 3 (three) times daily. 06/09/19 07/09/19  Parks, Laurie Britton, NP  hydrOXYzine (VISTARIL) 25 MG capsule Take 1 capsule (25 mg total) by mouth 3 (three) times daily as needed for anxiety. 06/09/19   Parks, Laurie Britton, NP  metoprolol tartrate (LOPRESSOR) 25 MG tablet Take 1 tablet (25 mg total) by mouth 2 (two) times daily. 06/09/19   Parks, Laurie Britton, NP    Family History No family history on file.  Social History Social History   Tobacco Use  . Smoking status: Never Smoker  . Smokeless tobacco: Never Used  Substance Use Topics  . Alcohol use: No  . Drug use: No     Allergies   Patient has no known allergies.   Review of Systems Review of Systems  Constitutional: Negative for chills and fever.  HENT: Negative for congestion.   Respiratory: Negative for chest tightness and shortness   of breath.   Cardiovascular: Negative for chest pain.  Gastrointestinal: Negative for abdominal pain.  Genitourinary: Negative for dysuria and urgency.  Musculoskeletal: Negative for back pain and neck pain.       Pain and right lower leg.  Skin: Positive for rash.  Neurological: Negative for weakness and headaches.  Psychiatric/Behavioral: Negative for confusion.  All other systems reviewed and are negative.    Physical Exam Updated Vital Signs BP (!) 136/106   Pulse 98   Temp 98.3 F (36.8 C) (Oral)   Resp 16   Ht 5' 2" (1.575 m)   Wt 105.7 kg   SpO2 99%   BMI 42.62 kg/m   Physical Exam Vitals signs and nursing note reviewed.   Constitutional:      General: She is not in acute distress.    Appearance: She is well-developed. She is not diaphoretic.  HENT:     Head: Normocephalic and atraumatic.     Mouth/Throat:     Mouth: Mucous membranes are moist.     Comments: No obvious intraoral or perioral lesions. Eyes:     General: No scleral icterus.       Right eye: No discharge.        Left eye: No discharge.     Conjunctiva/sclera: Conjunctivae normal.  Neck:     Musculoskeletal: Normal range of motion.  Cardiovascular:     Rate and Rhythm: Normal rate and regular rhythm.     Pulses: Normal pulses.     Heart sounds: Normal heart sounds.  Pulmonary:     Effort: Pulmonary effort is normal. No respiratory distress.     Breath sounds: Normal breath sounds. No stridor.  Abdominal:     General: There is no distension.  Musculoskeletal:        General: No deformity.     Right lower leg: No edema.     Left lower leg: No edema.  Skin:    General: Skin is warm and dry.     Findings: Rash present. Rash is purpuric.     Comments: Please see clinical images.  There is a purpuritic rash over bilateral lower extremities, arms and torso.  This rash does not blanch.  This rash is raised.  It varies in size from under 1 cm up to 2 to 3 cm x 2 cm.   Of note patient shoes provide compression across the top of her foot, and in this area there are no visible lesions.  Neurological:     General: No focal deficit present.     Mental Status: She is alert and oriented to person, place, and time.     Motor: No abnormal muscle tone.  Psychiatric:        Mood and Affect: Mood normal.        Behavior: Behavior normal.              ED Treatments / Results  Labs (all labs ordered are listed, but only abnormal results are displayed) Labs Reviewed  COMPREHENSIVE METABOLIC PANEL - Abnormal; Notable for the following components:      Result Value   Glucose, Bld 110 (*)    Creatinine, Ser 1.40 (*)    Total Bilirubin 0.1  (*)    GFR calc non Af Amer 44 (*)    GFR calc Af Amer 51 (*)    All other components within normal limits  CBC WITH DIFFERENTIAL/PLATELET - Abnormal; Notable for the following components:   Abs Immature Granulocytes  0.08 (*)    All other components within normal limits  SEDIMENTATION RATE - Abnormal; Notable for the following components:   Sed Rate 28 (*)    All other components within normal limits  SARS CORONAVIRUS 2 (TAT 6-24 HRS)  LACTIC ACID, PLASMA  LACTIC ACID, PLASMA  HCG, QUANTITATIVE, PREGNANCY  C-REACTIVE PROTEIN  I-STAT BETA HCG BLOOD, ED ([facility], [facility], [facility] ONLY)    EKG None  Radiology No results found.  Procedures Procedures (including critical care time)  Medications Ordered in ED Medications  sodium chloride 0.9 % bolus 500 mL (0 mLs Intravenous Stopped 09/09/19 2349)  morphine 2 MG/ML injection 2 mg (2 mg Intravenous Given 09/09/19 2349)     Initial Impression / Assessment and Plan / ED Course  I have reviewed the triage vital signs and the nursing notes.  Pertinent labs & imaging results that were available during my care of the patient were reviewed by me and considered in my medical decision making (see chart for details).  Clinical Course as of Sep 09 36  Sat Sep 09, 2019  2157 Dr. Si Raider hospitalist [facility]    [EH]  [place] line for transfer.    [EH]  [facility] has a 48 hour wait   [EH]  [place] transfer line   [EH]  Sun Sep 10, 2019  0021 Dr. Lala Lund at [facility] accepts the patient in transfer.   [EH]    Clinical Course User Index [EH] Lorin Glass, PA-C      Patient presents today for evaluation of a rash that has been present for 3 days.  After the rash appeared she was started on doxycycline.  On exam she has a diffuse purpura rash primarily over her bilateral lower extremities however it does extend up into her arms and torso.  She is afebrile and generally well-appearing.  Labs were  obtained and reviewed she has an AKI with a creatinine of 1.40 up from her baseline of 0.6.  She does not have a significant leukocytosis.  Pregnancy test is negative.  ESR is elevated at 28.  CRP is normal at 0.8.  Coronavirus testing was sent.  Lactic acid is not elevated.  Based on the appearance of the rash along with AKI concern for vasculitis. Initially attempted to transfer patient to [facility] however they had a 48-hour wait for beds.  I spoke with Dr. Lala Lund at [facility] who accepts the patient in transfer.  Patient remained hemodynamically stable while in my care.  Her pain was treated with morphine and she was given 500 cc of fluid.  Final Clinical Impressions(s) / ED Diagnoses   Final diagnoses:  Rash  Vasculitis ([place])  AKI (acute kidney injury) [facility])    ED Discharge Orders    None       Ollen Gross 09/10/19 0037    Tegeler, Gwenyth Allegra, MD 09/10/19 2358

## 2019-09-09 NOTE — ED Triage Notes (Signed)
Pt presents with c/o a rash that started on Thursday. Pt states it started on her RLEand has since spread to her LLE, abdomen, and arms. Pt c/o "bone pain" to her RLE as well.

## 2019-09-10 ENCOUNTER — Other Ambulatory Visit: Payer: Self-pay

## 2019-09-10 LAB — SARS CORONAVIRUS 2 (TAT 6-24 HRS): SARS Coronavirus 2: NEGATIVE

## 2019-09-10 MED ORDER — GENERIC EXTERNAL MEDICATION
Status: DC
Start: ? — End: 2019-09-10

## 2019-09-10 MED ORDER — GENERIC EXTERNAL MEDICATION
100.00 | Status: DC
Start: 2019-09-11 — End: 2019-09-10

## 2019-09-10 MED ORDER — METOPROLOL TARTRATE 25 MG PO TABS
25.00 | ORAL_TABLET | ORAL | Status: DC
Start: 2019-09-11 — End: 2019-09-10

## 2019-09-10 MED ORDER — ACETAMINOPHEN 325 MG PO TABS
650.00 | ORAL_TABLET | ORAL | Status: DC
Start: ? — End: 2019-09-10

## 2019-09-10 MED ORDER — HYDROMORPHONE HCL 1 MG/ML IJ SOLN: 0.5000 mg | INTRAMUSCULAR | Status: DC | PRN

## 2019-09-10 MED ORDER — GABAPENTIN 400 MG PO CAPS
800.00 | ORAL_CAPSULE | ORAL | Status: DC
Start: 2019-09-11 — End: 2019-09-10

## 2019-09-10 MED ORDER — OXYCODONE-ACETAMINOPHEN 5-325 MG PO TABS
1.0000 | ORAL_TABLET | Freq: Once | ORAL | Status: AC
  Administered 2019-09-10: 1 via ORAL
  Filled 2019-09-10: qty 1

## 2019-09-10 MED ORDER — OXYCODONE HCL 5 MG PO TABS
5.00 | ORAL_TABLET | ORAL | Status: DC
Start: ? — End: 2019-09-10

## 2019-09-10 MED ORDER — CITALOPRAM HYDROBROMIDE 20 MG PO TABS
20.00 | ORAL_TABLET | ORAL | Status: DC
Start: 2019-09-11 — End: 2019-09-10

## 2019-09-10 MED ORDER — LIDOCAINE HCL 1 % IJ SOLN
0.50 | INTRAMUSCULAR | Status: DC
Start: ? — End: 2019-09-10

## 2019-09-19 ENCOUNTER — Other Ambulatory Visit: Payer: Self-pay

## 2019-09-19 ENCOUNTER — Emergency Department (HOSPITAL_COMMUNITY): Payer: Self-pay

## 2019-09-19 ENCOUNTER — Emergency Department (HOSPITAL_COMMUNITY)
Admission: EM | Admit: 2019-09-19 | Discharge: 2019-09-19 | Disposition: A | Discharge status: Home / Self Care | Payer: Self-pay | Attending: Emergency Medicine | Admitting: Emergency Medicine

## 2019-09-19 DIAGNOSIS — I10 Essential (primary) hypertension: Secondary | ICD-10-CM | POA: Insufficient documentation

## 2019-09-19 DIAGNOSIS — I776 Arteritis, unspecified: Secondary | ICD-10-CM | POA: Insufficient documentation

## 2019-09-19 DIAGNOSIS — Z79899 Other long term (current) drug therapy: Secondary | ICD-10-CM | POA: Insufficient documentation

## 2019-09-19 LAB — BASIC METABOLIC PANEL
Anion gap: 9 (ref 5–15)
BUN: 7 mg/dL (ref 6–20)
CO2: 26 mmol/L (ref 22–32)
Calcium: 8.6 mg/dL — ABNORMAL LOW (ref 8.9–10.3)
Chloride: 102 mmol/L (ref 98–111)
Creatinine, Ser: 0.56 mg/dL (ref 0.44–1.00)
GFR calc Af Amer: >60 mL/min (ref >60)
GFR calc non Af Amer: >60 mL/min (ref >60)
Glucose, Bld: 94 mg/dL (ref 70–99)
Potassium: 3.7 mmol/L (ref 3.5–5.1)
Sodium: 137 mmol/L (ref 135–145)

## 2019-09-19 LAB — URINALYSIS, ROUTINE W REFLEX MICROSCOPIC
Bilirubin Urine: NEGATIVE
Glucose, UA: NEGATIVE mg/dL
Ketones, ur: NEGATIVE mg/dL
Leukocytes,Ua: NEGATIVE
Nitrite: NEGATIVE
Protein, ur: NEGATIVE mg/dL
Specific Gravity, Urine: 1.012 (ref 1.005–1.030)
pH: 7 (ref 5.0–8.0)

## 2019-09-19 MED ORDER — PREDNISONE 20 MG PO TABS
50.0000 mg | ORAL_TABLET | Freq: Once | ORAL | Status: AC
  Administered 2019-09-19: 50 mg via ORAL
  Filled 2019-09-19: qty 3

## 2019-09-19 MED ORDER — PREDNISONE 20 MG PO TABS: 40.0000 mg | ORAL_TABLET | Freq: Every day | ORAL | 0 refills | Status: AC

## 2019-09-19 MED ORDER — MORPHINE SULFATE (PF) 4 MG/ML IV SOLN
4.0000 mg | Freq: Once | INTRAVENOUS | Status: AC
  Administered 2019-09-19: 4 mg via INTRAVENOUS
  Filled 2019-09-19: qty 1

## 2019-09-19 MED ORDER — OXYCODONE-ACETAMINOPHEN 5-325 MG PO TABS: 1.0000 | ORAL_TABLET | Freq: Four times a day (QID) | ORAL | 0 refills | Status: DC | PRN

## 2019-09-19 NOTE — Discharge Instructions (Addendum)
As we discussed please continue outpatient follow-up as previously scheduled.  Please follow-up with your primary care provider with assistance on obtaining local consultations with specialty.  Please return immediately if develop any new or worsening signs or symptoms.

## 2019-09-19 NOTE — ED Triage Notes (Signed)
Patient here with swelling to hands and feet, states she has vasculitis and needs steroids. Has hx of same, NAD

## 2019-09-19 NOTE — ED Notes (Signed)
DC instructions discussed and pt verbalizes understnding. Pt home stable with friend with printed copy of instructions.

## 2019-09-19 NOTE — ED Notes (Signed)
Patient transported to X-ray 

## 2019-09-19 NOTE — ED Notes (Signed)
Pt c/o pain at hands and minimal swelling noted at hands. Rash noted to abdomen , chest and all extremities. Pt states "it is flairing up again"

## 2019-09-19 NOTE — ED Provider Notes (Signed)
[facility] Provider Note   CSN: [id number] Arrival date & time: 09/19/19  0920     History   Chief Complaint No chief complaint on file.   HPI Brianna Randall is a 48 y.o. female.     HPI   48 year old female presents today with complaints of rash.  Patient has a past medical history of anxiety, depression, homelessness who was most recently seen and evaluated in the emergency room on 09/09/2019.  At that time she had similar rash, [facility] where she was diagnosed with IgA vasculitis, with AK I elevated at 1.4 from baseline of 1.0.  She notes that in the hospital she was given prednisone and pain medicine, and fluids which improved her pain, rash and edema of her hands and feet.  She was discharged home with outpatient follow-up with dermatology and rheumatology.  She notes she was discharged approximately 4 days ago and has slowly developed swelling and edema to the hands and feet with a returning rash.  She notes pain in the joints.  She denies any abdominal pain intraoral swelling or edema shortness of breath.  She notes normal urination occasionally dark when she does not drink plenty of water.  She does note a history hypertension but notes that she was taken off her medications at her last hospitalization to prevent any worsening kidney disease.    Past Medical History:  Diagnosis Date  . Anxiety   . Depression   . Kidney stones     Patient Active Problem List   Diagnosis Date Noted  . Depression 06/09/2019  . Cellulitis and abscess of right lower extremity   . Normocytic anemia 05/10/2019  . Psoriasis 05/10/2019  . History of nephrolithiasis 05/10/2019  . Obesity 05/10/2019  . Neuropathy 05/10/2019  . Hypertension 05/10/2019  . Soft tissue abscess 05/09/2019    Past Surgical History:  Procedure Laterality Date  . I&D EXTREMITY Right 05/11/2019   Procedure: IRRIGATION AND DEBRIDEMENT LEG AND  NECK ABSCESS;  Surgeon: Tarry KosXu, Naiping M, MD;  Location: [facility] OR;  Service: Orthopedics;  Laterality: Right;  . I&D EXTREMITY Right 05/15/2019   Procedure: REPEAT IRRIGATION AND DEBRIDEMENT OF LEG;  Surgeon: Tarry KosXu, Naiping M, MD;  Location: [facility] OR;  Service: Orthopedics;  Laterality: Right;  . INCISION AND DRAINAGE OF WOUND  05/11/2019   Procedure: Irrigation And Debridement Wound;  Surgeon: Tarry KosXu, Naiping M, MD;  [facility];  Service: Orthopedics;;     OB History   No obstetric history on file.      Home Medications    Prior to Admission medications   Medication Sig Start Date End Date Taking? Authorizing Provider  citalopram (CELEXA) 20 MG tablet Take 1 tablet (20 mg total) by mouth daily. 06/09/19   Laveda AbbeParks, Laurie Britton, NP  cyclobenzaprine (FLEXERIL) 5 MG tablet Take 5 mg by mouth 3 (three) times daily as needed for muscle spasms.    [provider]  gabapentin (NEURONTIN) 800 MG tablet Take 1 tablet (800 mg total) by mouth 3 (three) times daily. 06/09/19 07/09/19  Laveda AbbeParks, Laurie Britton, NP  hydrOXYzine (VISTARIL) 25 MG capsule Take 1 capsule (25 mg total) by mouth 3 (three) times daily as needed for anxiety. 06/09/19   Laveda AbbeParks, Laurie Britton, NP  metoprolol tartrate (LOPRESSOR) 25 MG tablet Take 1 tablet (25 mg total) by mouth 2 (two) times daily. 06/09/19   Laveda AbbeParks, Laurie Britton, NP  oxyCODONE-acetaminophen (PERCOCET/ROXICET) 5-325 MG tablet Take 1  tablet by mouth every 6 (six) hours as needed. 09/19/19   Emmet Messer, Tinnie Gens, PA-C  predniSONE (DELTASONE) 20 MG tablet Take 2 tablets (40 mg total) by mouth daily. 09/19/19   Eyvonne Mechanic, PA-C    Family History No family history on file.  Social History Social History   Tobacco Use  . Smoking status: Never Smoker  . Smokeless tobacco: Never Used  Substance Use Topics  . Alcohol use: No  . Drug use: No     Allergies   Patient has no known allergies.   Review of Systems Review of Systems  All other systems reviewed and are  negative.    Physical Exam Updated Vital Signs BP (!) 142/96   Pulse 89   Temp 98.6 F (37 C) (Oral)   Resp 16   Ht 5\' 2"  (1.575 m)   Wt 105.7 kg   SpO2 100%   BMI 42.62 kg/m   Physical Exam Vitals signs and nursing note reviewed.  Constitutional:      Appearance: She is well-developed.  HENT:     Head: Normocephalic and atraumatic.  Eyes:     General: No scleral icterus.       Right eye: No discharge.        Left eye: No discharge.     Conjunctiva/sclera: Conjunctivae normal.     Pupils: Pupils are equal, round, and reactive to light.  Neck:     Musculoskeletal: Normal range of motion.     Vascular: No JVD.     Trachea: No tracheal deviation.  Pulmonary:     Effort: Pulmonary effort is normal.     Breath sounds: No stridor.  Musculoskeletal:     Comments: Minor edema to the bilateral hands and feet  Skin:    Comments: Palpable purpuric rash to the upper and lower extremities  Neurological:     Mental Status: She is alert and oriented to person, place, and time.     Coordination: Coordination normal.  Psychiatric:        Behavior: Behavior normal.        Thought Content: Thought content normal.        Judgment: Judgment normal.      ED Treatments / Results  Labs (all labs ordered are listed, but only abnormal results are displayed) Labs Reviewed  URINALYSIS, ROUTINE W REFLEX MICROSCOPIC - Abnormal; Notable for the following components:      Result Value   Hgb urine dipstick SMALL (*)    Bacteria, UA RARE (*)    All other components within normal limits  BASIC METABOLIC PANEL - Abnormal; Notable for the following components:   Calcium 8.6 (*)    All other components within normal limits  CBC WITH DIFFERENTIAL/PLATELET    EKG None  Radiology Dg Chest 2 View  Result Date: 09/19/2019 CLINICAL DATA:  Extremity swelling.  History of vasculitis EXAM: CHEST - 2 VIEW COMPARISON:  None. FINDINGS: Normal heart size and mediastinal contours. No acute  infiltrate or edema. No effusion or pneumothorax. No acute osseous findings. IMPRESSION: No active cardiopulmonary disease. Electronically Signed   By: 09/21/2019 M.D.   On: 09/19/2019 11:09    Procedures Procedures (including critical care time)  Medications Ordered in ED Medications  morphine 4 MG/ML injection 4 mg (4 mg Intravenous Given 09/19/19 1132)  predniSONE (DELTASONE) tablet 50 mg (50 mg Oral Given 09/19/19 1128)     Initial Impression / Assessment and Plan / ED Course  I have reviewed the  triage vital signs and the nursing notes.  Pertinent labs & imaging results that were available during my care of the patient were reviewed by me and considered in my medical decision making (see chart for details).         Assessment/Plan: 48 year old female presents today with complaints of vasculitis.  Her symptoms are not as severe as her previous visit, she was given prednisone here which significantly has improved her edema.  She has no signs of renal insufficiency, protein in her urine, or any other concerning signs or symptoms at this time.  Patient does have scheduled outpatient follow-up she will continue her follow-up plan return immediately if she develops new or worsening signs or symptoms.  She verbalized understanding and agreement to today's plan had no further questions or concerns at time of discharge.    Final Clinical Impressions(s) / ED Diagnoses   Final diagnoses:  Vasculitis [facility])    ED Discharge Orders         Ordered    predniSONE (DELTASONE) 20 MG tablet  Daily     09/19/19 1324    oxyCODONE-acetaminophen (PERCOCET/ROXICET) 5-325 MG tablet  Every 6 hours PRN     09/19/19 1324           Okey Regal, PA-C 09/19/19 1327    Pattricia Boss, MD 09/19/19 [phone]

## 2019-10-02 ENCOUNTER — Emergency Department (HOSPITAL_COMMUNITY)
Admission: EM | Admit: 2019-10-02 | Discharge: 2019-10-02 | Disposition: A | Discharge status: Home / Self Care | Payer: Self-pay | Attending: Emergency Medicine | Admitting: Emergency Medicine

## 2019-10-02 ENCOUNTER — Encounter (HOSPITAL_COMMUNITY): Payer: Self-pay | Admitting: Emergency Medicine

## 2019-10-02 ENCOUNTER — Other Ambulatory Visit: Payer: Self-pay

## 2019-10-02 DIAGNOSIS — Z79899 Other long term (current) drug therapy: Secondary | ICD-10-CM | POA: Insufficient documentation

## 2019-10-02 DIAGNOSIS — I10 Essential (primary) hypertension: Secondary | ICD-10-CM | POA: Insufficient documentation

## 2019-10-02 DIAGNOSIS — D690 Allergic purpura: Secondary | ICD-10-CM | POA: Insufficient documentation

## 2019-10-02 DIAGNOSIS — L4050 Arthropathic psoriasis, unspecified: Secondary | ICD-10-CM | POA: Insufficient documentation

## 2019-10-02 DIAGNOSIS — B353 Tinea pedis: Secondary | ICD-10-CM | POA: Insufficient documentation

## 2019-10-02 DIAGNOSIS — Z5321 Procedure and treatment not carried out due to patient leaving prior to being seen by health care provider: Secondary | ICD-10-CM | POA: Insufficient documentation

## 2019-10-02 DIAGNOSIS — Z590 Homelessness: Secondary | ICD-10-CM | POA: Insufficient documentation

## 2019-10-02 DIAGNOSIS — R52 Pain, unspecified: Secondary | ICD-10-CM | POA: Insufficient documentation

## 2019-10-02 HISTORY — DX: Unspecified osteoarthritis, unspecified site: M19.90

## 2019-10-02 MED ORDER — OXYCODONE-ACETAMINOPHEN 5-325 MG PO TABS
1.0000 | ORAL_TABLET | Freq: Once | ORAL | Status: AC
  Administered 2019-10-02: 1 via ORAL
  Filled 2019-10-02: qty 1

## 2019-10-02 MED ORDER — METHYLPREDNISOLONE 4 MG PO TBPK: ORAL_TABLET | ORAL | 0 refills | Status: AC

## 2019-10-02 MED ORDER — PREDNISONE 20 MG PO TABS
60.0000 mg | ORAL_TABLET | Freq: Once | ORAL | Status: AC
  Administered 2019-10-02: 21:00:00 60 mg via ORAL
  Filled 2019-10-02: qty 3

## 2019-10-02 MED ORDER — KETOCONAZOLE 2 % EX CREA
TOPICAL_CREAM | Freq: Every day | CUTANEOUS | Status: DC
  Filled 2019-10-02: qty 15

## 2019-10-02 MED ORDER — CEPHALEXIN 500 MG PO CAPS: ORAL_CAPSULE | ORAL | 0 refills | Status: DC

## 2019-10-02 NOTE — ED Notes (Signed)
Discharge instructions discussed with pt. Pt verbalized understanding. Pt stable and ambulatory. No signature pad available. 

## 2019-10-02 NOTE — ED Provider Notes (Signed)
Patient is not in the room.  She has been shown on tracking board as in the room for some time.  Nursing staff will recheck waiting room.  They will notify me if patient has left without being seen.   Charlesetta Shanks, MD 10/02/19 1000

## 2019-10-02 NOTE — ED Triage Notes (Signed)
States was seen on the 13 and now paIN HAS FLARED UP AGAIN STATES PREDNISONE HEL;ped but it is back

## 2019-10-02 NOTE — Discharge Instructions (Signed)
Apply the ketoconazole between the toes once daily for the next 14 days.  Be sure to keep your socks dry as possible.  Follow-up with your rheumatologist at [facility], return for any new or worsening symptoms. Contact a health care provider if: Your signs and symptoms flare up. You have side effects from your medicines. You are taking a biologic and you have a fever or other signs of infection, such as: Chills. Feeling tired. Cough or sore throat. Loss of appetite.

## 2019-10-02 NOTE — ED Triage Notes (Signed)
Pt was seen on the 13th of this month and her pain has flared up. Pt was here at this facility this morning and left prior to getting seen. Pt reported the prednisone they gave her really worked well for her and she is out of same.

## 2019-10-02 NOTE — ED Provider Notes (Signed)
[place] EMERGENCY DEPARTMENT Provider Note   CSN: [id number] Arrival date & time: 10/02/19  1334     History   Chief Complaint Chief Complaint  Patient presents with  . Arthritis Pain    HPI Brianna Randall is a 48 y.o. female with a past medical history of psoriasis, psoriatic arthritis, IgA vasculitis.  She is followed at [facility].  Patient states that she is going to see a rheumatologist on November.  Patient is currently homeless.  She states that she is having severe joint pain and pain from her vasculitic rash.  She states that she would like to have a dose of prednisone again which seems to help her symptoms significantly.  She denies any fever or chills.     HPI  Past Medical History:  Diagnosis Date  . Anxiety   . Arthritis   . Depression   . Kidney stones     Patient Active Problem List   Diagnosis Date Noted  . Depression 06/09/2019  . Cellulitis and abscess of right lower extremity   . Normocytic anemia 05/10/2019  . Psoriasis 05/10/2019  . History of nephrolithiasis 05/10/2019  . Obesity 05/10/2019  . Neuropathy 05/10/2019  . Hypertension 05/10/2019  . Soft tissue abscess 05/09/2019    Past Surgical History:  Procedure Laterality Date  . I&D EXTREMITY Right 05/11/2019   Procedure: IRRIGATION AND DEBRIDEMENT LEG AND NECK ABSCESS;  Surgeon: Leandrew Koyanagi, MD;  Location: [facility];  Service: Orthopedics;  Laterality: Right;  . I&D EXTREMITY Right 05/15/2019   Procedure: REPEAT IRRIGATION AND DEBRIDEMENT OF LEG;  Surgeon: Leandrew Koyanagi, MD;  Location: [place];  Service: Orthopedics;  Laterality: Right;  . INCISION AND DRAINAGE OF WOUND  05/11/2019   Procedure: Irrigation And Debridement Wound;  Surgeon: Leandrew Koyanagi, MD;  [facility];  Service: Orthopedics;;     OB History   No obstetric history on file.      Home Medications    Prior to Admission medications   Medication Sig Start Date End Date Taking? Authorizing Provider  citalopram  (CELEXA) 20 MG tablet Take 1 tablet (20 mg total) by mouth daily. 06/09/19   Ethelene Hal, NP  cyclobenzaprine (FLEXERIL) 5 MG tablet Take 5 mg by mouth 3 (three) times daily as needed for muscle spasms.    [provider]  gabapentin (NEURONTIN) 800 MG tablet Take 1 tablet (800 mg total) by mouth 3 (three) times daily. 06/09/19 07/09/19  Ethelene Hal, NP  hydrOXYzine (VISTARIL) 25 MG capsule Take 1 capsule (25 mg total) by mouth 3 (three) times daily as needed for anxiety. 06/09/19   Ethelene Hal, NP  metoprolol tartrate (LOPRESSOR) 25 MG tablet Take 1 tablet (25 mg total) by mouth 2 (two) times daily. 06/09/19   Ethelene Hal, NP  oxyCODONE-acetaminophen (PERCOCET/ROXICET) 5-325 MG tablet Take 1 tablet by mouth every 6 (six) hours as needed. 09/19/19   Hedges, Dellis Filbert, PA-C  predniSONE (DELTASONE) 20 MG tablet Take 2 tablets (40 mg total) by mouth daily. 09/19/19   Okey Regal, PA-C    Family History No family history on file.  Social History Social History   Tobacco Use  . Smoking status: Never Smoker  . Smokeless tobacco: Never Used  Substance Use Topics  . Alcohol use: No  . Drug use: No     Allergies   Doxycycline   Review of Systems Review of Systems  Ten systems reviewed and are negative for acute  change, except as noted in the HPI.   Physical Exam Updated Vital Signs BP (!) 156/94   Pulse 100   Temp 99.2 F (37.3 C) (Oral)   Resp 16   Ht 5\' 2"  (1.575 m)   Wt 93.4 kg   LMP  (Exact Date)   SpO2 100%   BMI 37.68 kg/m   Physical Exam Vitals signs and nursing note reviewed.  Constitutional:      General: She is not in acute distress.    Appearance: She is well-developed. She is not diaphoretic.  HENT:     Head: Normocephalic and atraumatic.  Eyes:     General: No scleral icterus.    Conjunctiva/sclera: Conjunctivae normal.  Neck:     Musculoskeletal: Normal range of motion.  Cardiovascular:     Rate and Rhythm:  Normal rate and regular rhythm.     Heart sounds: Normal heart sounds. No murmur. No friction rub. No gallop.   Pulmonary:     Effort: Pulmonary effort is normal. No respiratory distress.     Breath sounds: Normal breath sounds.  Abdominal:     General: Bowel sounds are normal. There is no distension.     Palpations: Abdomen is soft. There is no mass.     Tenderness: There is no abdominal tenderness. There is no guarding.  Skin:    General: Skin is warm and dry.     Findings: Rash present.     Comments: Well-circumscribed, erythematous macul low papular and purpuric rash of the lower and upper extremities Macerated and peeling skin between the toes  Neurological:     Mental Status: She is alert and oriented to person, place, and time.  Psychiatric:        Behavior: Behavior normal.      ED Treatments / Results  Labs (all labs ordered are listed, but only abnormal results are displayed) Labs Reviewed - No data to display  EKG None  Radiology No results found.  Procedures Procedures (including critical care time)  Medications Ordered in ED Medications  ketoconazole (NIZORAL) 2 % cream (has no administration in time range)  oxyCODONE-acetaminophen (PERCOCET/ROXICET) 5-325 MG per tablet 1 tablet (1 tablet Oral Given 10/02/19 2042)  predniSONE (DELTASONE) tablet 60 mg (60 mg Oral Given 10/02/19 2042)     Initial Impression / Assessment and Plan / ED Course  I have reviewed the triage vital signs and the nursing notes.  Pertinent labs & imaging results that were available during my care of the patient were reviewed by me and considered in my medical decision making (see chart for details).        Patient here with rheumatologic disorder, she has tinea in between the toes.  There is some erythema and warmth of the feet.  Patient states that this is common with her disorders.  She states that the pain is burning but denies any deep pain in the feet.  We will give an  antibiotic to hold.  She does have tinea pedis which could be contributing to potential secondary cellulitic changes.  Patient does not have fever or chills at this time.  I have ordered ketoconazole, Keflex to hold and Medrol Dosepak.  Patient appears appropriate for discharge at this time  Final Clinical Impressions(s) / ED Diagnoses   Final diagnoses:  None    ED Discharge Orders    None       2043, PA-C 10/02/19 2156    2157, MD 10/03/19 [phone]

## 2019-11-21 ENCOUNTER — Emergency Department (HOSPITAL_COMMUNITY)
Admission: EM | Admit: 2019-11-21 | Discharge: 2019-11-21 | Disposition: A | Discharge status: Home / Self Care | Payer: No Typology Code available for payment source | Attending: Pediatric Emergency Medicine | Admitting: Pediatric Emergency Medicine

## 2019-11-21 ENCOUNTER — Other Ambulatory Visit: Payer: Self-pay

## 2019-11-21 ENCOUNTER — Emergency Department (HOSPITAL_COMMUNITY): Payer: No Typology Code available for payment source

## 2019-11-21 DIAGNOSIS — I10 Essential (primary) hypertension: Secondary | ICD-10-CM | POA: Diagnosis not present

## 2019-11-21 DIAGNOSIS — Y93I9 Activity, other involving external motion: Secondary | ICD-10-CM | POA: Diagnosis not present

## 2019-11-21 DIAGNOSIS — Y929 Unspecified place or not applicable: Secondary | ICD-10-CM | POA: Diagnosis not present

## 2019-11-21 DIAGNOSIS — S80922A Unspecified superficial injury of left lower leg, initial encounter: Secondary | ICD-10-CM | POA: Diagnosis present

## 2019-11-21 DIAGNOSIS — S93402A Sprain of unspecified ligament of left ankle, initial encounter: Secondary | ICD-10-CM

## 2019-11-21 DIAGNOSIS — Y999 Unspecified external cause status: Secondary | ICD-10-CM | POA: Diagnosis not present

## 2019-11-21 DIAGNOSIS — M79605 Pain in left leg: Secondary | ICD-10-CM

## 2019-11-21 DIAGNOSIS — Z79899 Other long term (current) drug therapy: Secondary | ICD-10-CM | POA: Insufficient documentation

## 2019-11-21 MED ORDER — OXYCODONE-ACETAMINOPHEN 5-325 MG PO TABS
1.0000 | ORAL_TABLET | Freq: Once | ORAL | Status: AC
  Administered 2019-11-21: 16:00:00 1 via ORAL
  Filled 2019-11-21: qty 1

## 2019-11-21 MED ORDER — BACITRACIN ZINC 500 UNIT/GM EX OINT: TOPICAL_OINTMENT | Freq: Two times a day (BID) | CUTANEOUS | Status: DC
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
## 2019-11-21 NOTE — Progress Notes (Signed)
Orthopedic Tech Progress Note Patient Details:  Marlo Arriola Gilkison Jun 26, 1971 [id number]  Ortho Devices Type of Ortho Device: Ankle Air splint, Crutches Ortho Device/Splint Location: LLE Ortho Device/Splint Interventions: Adjustment, Application, Ordered   Post Interventions Patient Tolerated: Ambulated well, Well Instructions Provided: Care of device, Adjustment of device, Poper ambulation with device   Janit Pagan 11/21/2019, 5:06 PM
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
## 2019-12-22 ENCOUNTER — Other Ambulatory Visit: Payer: Self-pay

## 2019-12-22 ENCOUNTER — Emergency Department (HOSPITAL_COMMUNITY)
Admission: EM | Admit: 2019-12-22 | Discharge: 2019-12-22 | Disposition: A | Discharge status: Home / Self Care | Payer: Self-pay | Attending: Emergency Medicine | Admitting: Emergency Medicine

## 2019-12-22 ENCOUNTER — Encounter (HOSPITAL_COMMUNITY): Payer: Self-pay

## 2019-12-22 DIAGNOSIS — M79604 Pain in right leg: Secondary | ICD-10-CM | POA: Insufficient documentation

## 2019-12-22 DIAGNOSIS — L089 Local infection of the skin and subcutaneous tissue, unspecified: Secondary | ICD-10-CM | POA: Insufficient documentation

## 2019-12-22 DIAGNOSIS — Z5321 Procedure and treatment not carried out due to patient leaving prior to being seen by health care provider: Secondary | ICD-10-CM | POA: Insufficient documentation

## 2019-12-22 NOTE — ED Notes (Signed)
Called patient for room, no answer 

## 2019-12-22 NOTE — ED Triage Notes (Signed)
Pt has open sore to posterior L leg x 1-2 weeks. Redness surround sore, pt reports she believes it to be infected. Also reports R leg pain to the "bone in her R leg". No known injury or trauma

## 2020-01-12 ENCOUNTER — Other Ambulatory Visit: Payer: Self-pay

## 2020-01-12 DIAGNOSIS — Z20822 Contact with and (suspected) exposure to covid-19: Secondary | ICD-10-CM

## 2020-01-14 LAB — NOVEL CORONAVIRUS, NAA: SARS-CoV-2, NAA: NOT DETECTED

## 2020-04-17 ENCOUNTER — Encounter: Payer: Self-pay | Admitting: *Deleted

## 2020-04-17 NOTE — Congregational Nurse Program (Signed)
  Dept: [phone]   Congregational Nurse Program Note  Date of Encounter: 04/17/2020  Past Medical History: Past Medical History:  Diagnosis Date  . Anxiety   . Arthritis   . Depression   . Kidney stones     Encounter Details: CNP Questionnaire - 04/17/20 1317      Questionnaire   Patient Status  Not Applicable    Race  White or Caucasian    Location Patient Served At  [employer]  Not Applicable    Uninsured  Uninsured (NEW 1x/quarter)    Food  No food insecurities    Housing/Utilities  No permanent housing    Transportation  Yes, need transportation assistance    Interpersonal Safety  No, do not feel physically and emotionally safe where you currently live    Medication  No medication insecurities    Medical Provider  Yes    Referrals  [employer], [employer]    ED Visit Averted  Not Applicable    Life-Saving Intervention Made  Not Applicable      Pt has a hx of hypertension and is taking medication. She came in and had a blood pressure check. 131/87. Reported to NP. Pt is being referred for an orange card at her request. Marshal Schrecengost W. RN [phone]

## 2020-08-25 ENCOUNTER — Emergency Department (HOSPITAL_COMMUNITY)
Admission: EM | Admit: 2020-08-25 | Discharge: 2020-08-26 | Disposition: A | Discharge status: Home / Self Care | Payer: Self-pay | Attending: Emergency Medicine | Admitting: Emergency Medicine

## 2020-08-25 ENCOUNTER — Encounter (HOSPITAL_COMMUNITY): Payer: Self-pay

## 2020-08-25 ENCOUNTER — Other Ambulatory Visit: Payer: Self-pay

## 2020-08-25 DIAGNOSIS — I10 Essential (primary) hypertension: Secondary | ICD-10-CM | POA: Insufficient documentation

## 2020-08-25 DIAGNOSIS — M542 Cervicalgia: Secondary | ICD-10-CM | POA: Insufficient documentation

## 2020-08-25 DIAGNOSIS — S0083XA Contusion of other part of head, initial encounter: Secondary | ICD-10-CM | POA: Insufficient documentation

## 2020-08-25 DIAGNOSIS — R519 Headache, unspecified: Secondary | ICD-10-CM | POA: Insufficient documentation

## 2020-08-25 DIAGNOSIS — Z79899 Other long term (current) drug therapy: Secondary | ICD-10-CM | POA: Insufficient documentation

## 2020-08-25 DIAGNOSIS — T07XXXA Unspecified multiple injuries, initial encounter: Secondary | ICD-10-CM

## 2020-08-25 DIAGNOSIS — S5001XA Contusion of right elbow, initial encounter: Secondary | ICD-10-CM | POA: Insufficient documentation
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
## 2020-08-26 ENCOUNTER — Emergency Department (HOSPITAL_COMMUNITY): Payer: Self-pay

## 2020-08-26 MED ORDER — OXYCODONE HCL 5 MG PO TABS: 5.0000 mg | ORAL_TABLET | Freq: Four times a day (QID) | ORAL | 0 refills | Status: DC | PRN

## 2020-08-26 MED ORDER — OXYCODONE HCL 5 MG PO TABS
10.0000 mg | ORAL_TABLET | Freq: Once | ORAL | Status: AC
  Administered 2020-08-26: 10 mg via ORAL
  Filled 2020-08-26: qty 2

## 2020-08-26 MED ORDER — METHOCARBAMOL 500 MG PO TABS: 500.0000 mg | ORAL_TABLET | Freq: Two times a day (BID) | ORAL | 0 refills | Status: DC

## 2020-08-26 NOTE — Discharge Instructions (Addendum)
1. Medications: alternate naprosyn and tylenol for pain control, Roxicodone for breakthrough pain, usual home medications 2. Treatment: rest, ice, drink plenty of fluids, gentle stretching 3. Follow Up: Please followup with your PCP in 1 week if no improvement for discussion of your diagnoses and further evaluation after today's visit; Please return to the ER for worsening symptoms or other concerns
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
## 2020-09-21 ENCOUNTER — Other Ambulatory Visit: Payer: Self-pay

## 2020-09-21 ENCOUNTER — Emergency Department (HOSPITAL_COMMUNITY)
Admission: EM | Admit: 2020-09-21 | Discharge: 2020-09-21 | Disposition: A | Discharge status: Home / Self Care | Payer: Self-pay | Attending: Emergency Medicine | Admitting: Emergency Medicine

## 2020-09-21 DIAGNOSIS — L089 Local infection of the skin and subcutaneous tissue, unspecified: Secondary | ICD-10-CM | POA: Insufficient documentation

## 2020-09-21 DIAGNOSIS — Z79899 Other long term (current) drug therapy: Secondary | ICD-10-CM | POA: Insufficient documentation

## 2020-09-21 DIAGNOSIS — I10 Essential (primary) hypertension: Secondary | ICD-10-CM | POA: Insufficient documentation

## 2020-09-21 DIAGNOSIS — S81001A Unspecified open wound, right knee, initial encounter: Secondary | ICD-10-CM | POA: Insufficient documentation

## 2020-09-21 DIAGNOSIS — T148XXA Other injury of unspecified body region, initial encounter: Secondary | ICD-10-CM

## 2020-09-21 DIAGNOSIS — X58XXXA Exposure to other specified factors, initial encounter: Secondary | ICD-10-CM | POA: Insufficient documentation

## 2020-09-21 MED ORDER — HYDROCODONE-ACETAMINOPHEN 5-325 MG PO TABS
1.0000 | ORAL_TABLET | Freq: Once | ORAL | Status: AC
  Administered 2020-09-21: 1 via ORAL
  Filled 2020-09-21: qty 1

## 2020-09-21 MED ORDER — CLINDAMYCIN HCL 150 MG PO CAPS: 450.0000 mg | ORAL_CAPSULE | Freq: Four times a day (QID) | ORAL | 0 refills | Status: AC

## 2020-09-21 MED ORDER — CLINDAMYCIN HCL 150 MG PO CAPS
450.0000 mg | ORAL_CAPSULE | Freq: Once | ORAL | Status: AC
  Administered 2020-09-21: 450 mg via ORAL
  Filled 2020-09-21: qty 3

## 2020-09-21 NOTE — ED Triage Notes (Signed)
Pt. Stated, I think I have another staph infection on my rt. Leg right below the right knee.  Started 2 days.

## 2020-09-21 NOTE — ED Provider Notes (Signed)
[facility] EMERGENCY DEPARTMENT Provider Note   CSN: [id number] Arrival date & time: 09/21/20  1043     History Chief Complaint  Patient presents with  . Wound Infection  . Leg Pain    Brianna Randall is a 49 y.o. female.  HPI Patient is a 49 year old female with past medical history of anxiety, arthritis, depression, neuropathy.  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  She has a history of staph infections in the past.  She denies any fevers but states that she has chills and states that she has had a very that has for the past 2 days began to seep whitish-yellow liquid is painful and red and tender to touch.  She denies any mitigating factors although she has been taking some ibuprofen this does not help much.  She denies any aggravating factors other than more pain with touch and movement.  She is homeless and states that she is able to walk however when she sits down she notices how much it hurts and is throbbing.  She denies any chest pain abdominal pain, lightheadedness, dizziness, weakness, fatigue, malaise.  No nausea vomiting or diarrhea.      Past Medical History:  Diagnosis Date  . Anxiety   . Arthritis   . Depression   . Kidney stones     Patient Active Problem List   Diagnosis Date Noted  . Depression 06/09/2019  . Cellulitis and abscess of right lower extremity   . Normocytic anemia 05/10/2019  . Psoriasis 05/10/2019  . History of nephrolithiasis 05/10/2019  . Obesity 05/10/2019  . Neuropathy 05/10/2019  . Hypertension 05/10/2019  . Soft tissue abscess 05/09/2019    Past Surgical History:  Procedure Laterality Date  . I & D EXTREMITY Right 05/11/2019   Procedure: IRRIGATION AND DEBRIDEMENT LEG AND NECK ABSCESS;  Surgeon: Tarry Kos, MD;  Location: [facility] OR;  Service: Orthopedics;  Laterality: Right;  . I & D EXTREMITY Right 05/15/2019   Procedure: REPEAT IRRIGATION AND DEBRIDEMENT OF LEG;   Surgeon: Tarry Kos, MD;  Location: [facility] OR;  Service: Orthopedics;  Laterality: Right;  . INCISION AND DRAINAGE OF WOUND  05/11/2019   Procedure: Irrigation And Debridement Wound;  Surgeon: Tarry Kos, MD;  Location: [facility] OR;  Service: Orthopedics;;     OB History   No obstetric history on file.     No family history on file.  Social History   Tobacco Use  . Smoking status: Never Smoker  . Smokeless tobacco: Never Used  Vaping Use  . Vaping Use: Never used  Substance Use Topics  . Alcohol use: No  . Drug use: No    Home Medications Prior to Admission medications   Medication Sig Start Date End Date Taking? Authorizing Provider  citalopram (CELEXA) 20 MG tablet Take 1 tablet (20 mg total) by mouth daily. 06/09/19   Laveda Abbe, NP  clindamycin (CLEOCIN) 150 MG capsule Take 3 capsules (450 mg total) by mouth every 6 (six) hours for 5 days. 09/21/20 09/26/20  Gailen Shelter, PA  cyclobenzaprine (FLEXERIL) 5 MG tablet Take 5 mg by mouth 3 (three) times daily as needed for muscle spasms.    [provider]  gabapentin (NEURONTIN) 800 MG tablet Take 1 tablet (800 mg total) by mouth 3 (three) times daily. 06/09/19 07/09/19  Laveda Abbe, NP  methylPREDNISolone (MEDROL DOSEPAK) 4 MG TBPK tablet Use as directed 10/02/19  Arthor Captain, PA-C  metoprolol tartrate (LOPRESSOR) 25 MG tablet Take 1 tablet (25 mg total) by mouth 2 (two) times daily. 06/09/19   Laveda Abbe, NP  predniSONE (DELTASONE) 20 MG tablet Take 2 tablets (40 mg total) by mouth daily. 09/19/19   Hedges, Tinnie Gens, PA-C    Allergies    Doxycycline  Review of Systems   Review of Systems  Constitutional: Negative for fever.  HENT: Negative for congestion.   Respiratory: Negative for shortness of breath.   Cardiovascular: Negative for chest pain.  Gastrointestinal: Negative for abdominal distention.  Skin: Positive for wound.  Neurological: Negative for dizziness and headaches.     Physical Exam Updated Vital Signs BP 133/78 (BP Location: Right Arm)   Pulse 86   Temp 98.8 F (37.1 C) (Oral)   Resp 20   SpO2 100%   Physical Exam Vitals and nursing note reviewed.  Constitutional:      General: She is not in acute distress.    Appearance: Normal appearance. She is not ill-appearing.     Comments: Pleasant, well-appearing 49 year old female in no acute distress and comfortably in bed.  Pleasant, able to answer questions appropriately follow commands.  HENT:     Head: Normocephalic and atraumatic.  Eyes:     General: No scleral icterus.       Right eye: No discharge.        Left eye: No discharge.     Conjunctiva/sclera: Conjunctivae normal.  Cardiovascular:     Rate and Rhythm: Normal rate.  Pulmonary:     Effort: Pulmonary effort is normal. No respiratory distress.     Breath sounds: No stridor.  Musculoskeletal:        General: Normal range of motion.     Comments: Full range of motion of right knee.  Full range of motion of right hip and ankle.  Skin:    General: Skin is warm and dry.     Capillary Refill: Capillary refill takes less than 2 seconds.     Comments: Wound to the inferior medial aspect of the patella which is shallow with only mild ulceration.  Is approximately 1 cm in diameter there is some purulent drainage expressed when palpated.  Some warmth to touch.  No lymphangitis noted.  Sensation intact.  Old scars to the anterior right shin from prior surgeries.  Well-healed.  Neurological:     Mental Status: She is alert and oriented to person, place, and time. Mental status is at baseline.     Comments: Sensation intact in bilateral lower extremities.       ED Results / Procedures / Treatments   Labs (all labs ordered are listed, but only abnormal results are displayed) Labs Reviewed - No data to display  EKG None  Radiology No results found.  Procedures Procedures (including critical care time)  Medications Ordered in  ED Medications  clindamycin (CLEOCIN) capsule 450 mg (has no administration in time range)  HYDROcodone-acetaminophen (NORCO/VICODIN) 5-325 MG per tablet 1 tablet (has no administration in time range)    ED Course  I have reviewed the triage vital signs and the nursing notes.  Pertinent labs & imaging results that were available during my care of the patient were reviewed by me and considered in my medical decision making (see chart for details).    MDM Rules/Calculators/A&P                          Patient  is 49 year old female with past medical history detailed above which does include some immunosuppression due to steroid use and autoimmune diseases.  She is presented today with wound to the right knee.  It is purulence does require MRSA coverage and she has a history of staph aureus infections.  We will treat with clindamycin.  She has no systemic symptoms and is overall well-appearing.  She has full range of motion of the knee doubt septic arthritis.  Patient will follow up with primary care doctor.  Return precautions to the emergency department given.  Will cover with clindamycin.  She understands plan.    Final Clinical Impression(s) / ED Diagnoses Final diagnoses:  Wound infection    Rx / DC Orders ED Discharge Orders         Ordered    clindamycin (CLEOCIN) 150 MG capsule  Every 6 hours        09/21/20 1436           Solon Augusta [place], [place] 09/21/20 1449    Arby Barrette, MD 09/22/20 1449

## 2020-09-21 NOTE — Discharge Instructions (Addendum)
Please follow-up with your primary care doctor.  I have placed your primary care doctors phone number and address in your discharge paperwork.  Please take the antibiotic I prescribed you as prescribed.  Please not miss any doses.  Drain plenty of water.  Also recommend that you use a probiotic containing lactobacillus while you are taking the antibiotic and for the following week.  You will take 3 capsules at a time 3 times a day (every 6 hours)

## 2021-02-01 IMAGING — CT CT NECK WITH CONTRAST
5 of 6 series · 14 of 33 positions shown, 16 images · IV contrast (omnipaque)
Comparison: CT neck without contrast 05/10/2019

CLINICAL DATA: Right neck abscess.

EXAM:
CT NECK WITH CONTRAST
TECHNIQUE: Multidetector CT imaging of the neck was performed using the
standard protocol following the bolus administration of intravenous
contrast.
CONTRAST:  75mL OMNIPAQUE IOHEXOL 300 MG/ML  SOLN

[Series 3: axial neck · axial · 0.48mm/px · z∈[+1066,+1144]mm · 2 of 117 slices shown, 3 images]
[im 39/117  soft-tissue]
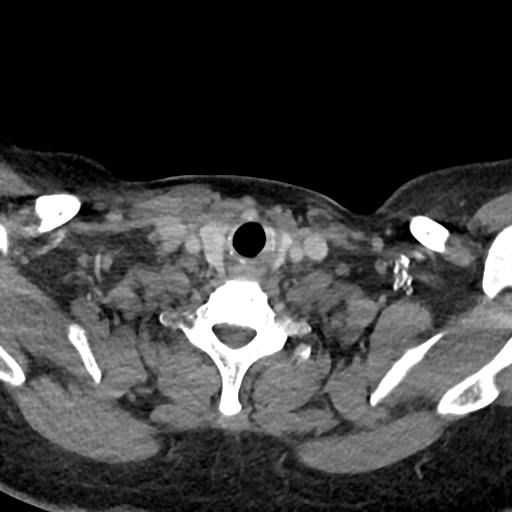
[im 39/117  bone]
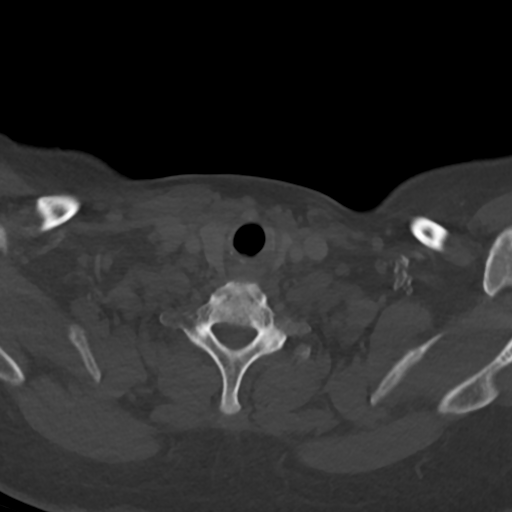
[im 78/117  bone]
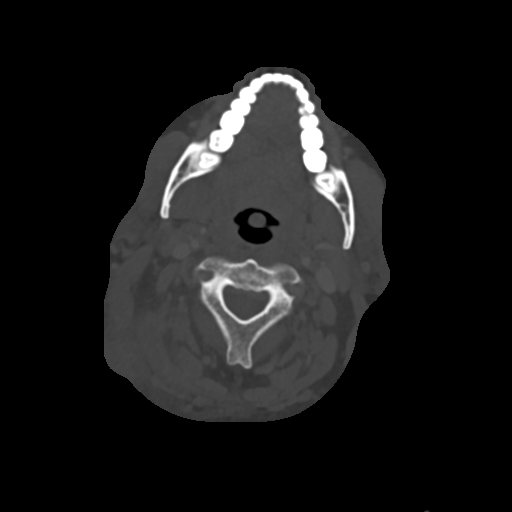

[Series 4: axial bone · axial · 0.48mm/px · z∈[+1066,+1144]mm · 2 of 117 slices shown]
[im 39/117  bone]
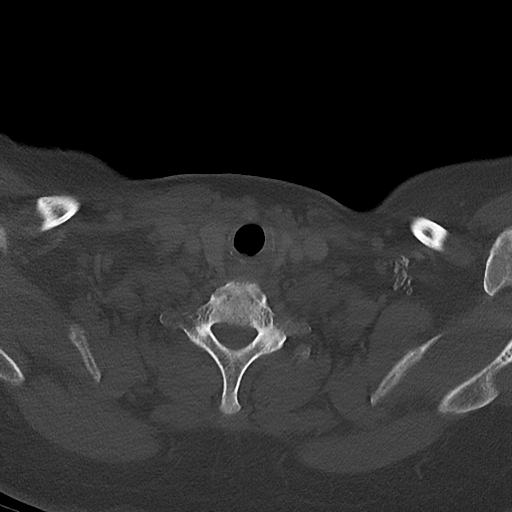
[im 78/117  bone]
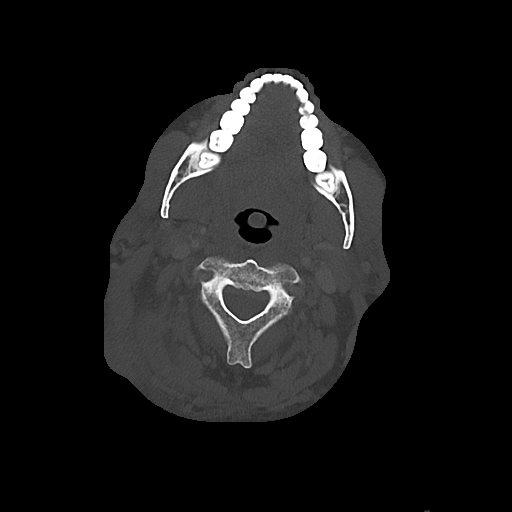

[Series 6: sag neck · sagittal · 0.48mm/px · 5 of 101 slices shown, 6 images]
[im 34/101  bone]
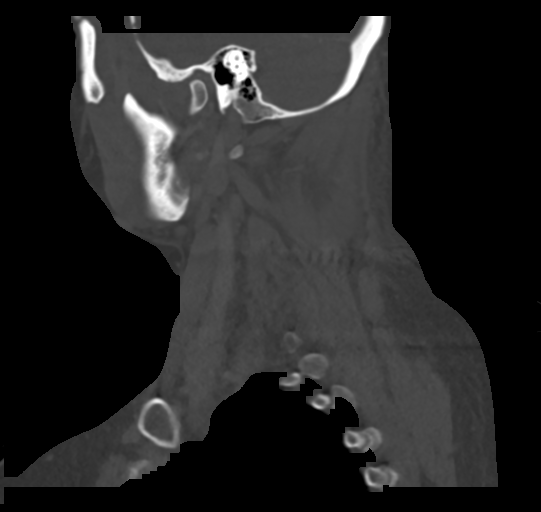
[im 42/101  bone]
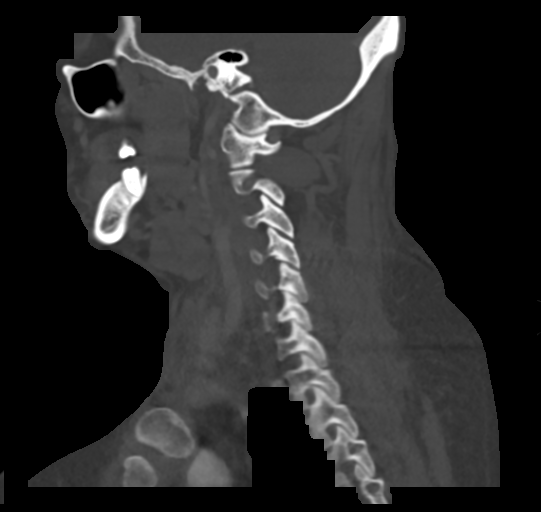
[im 51/101  soft-tissue]
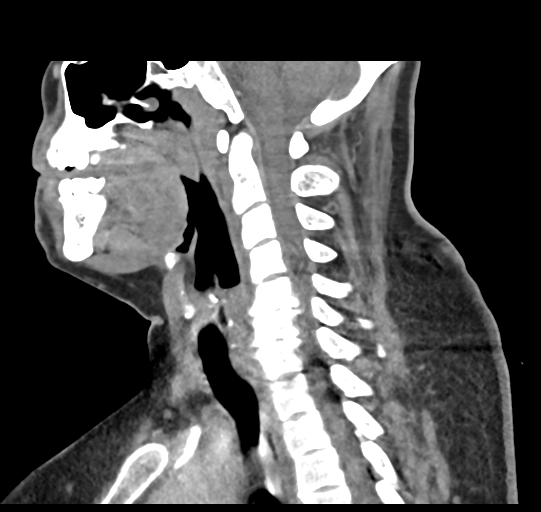
[im 51/101  bone]
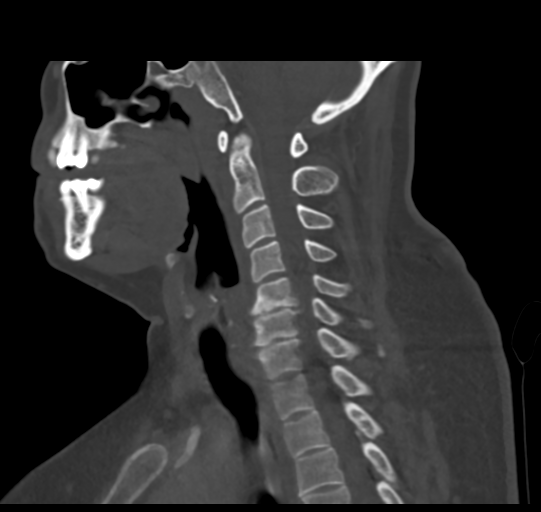
[im 59/101  bone]
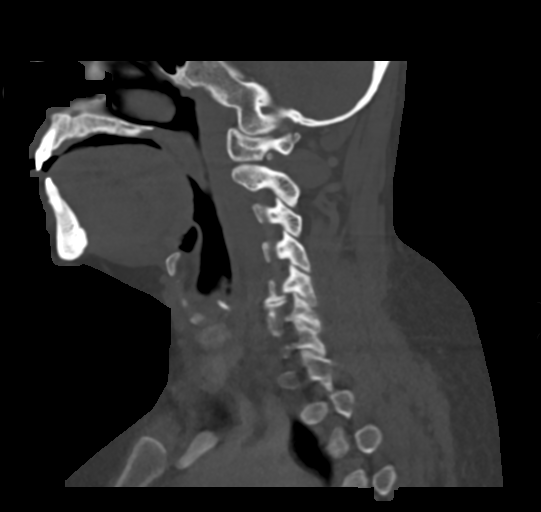
[im 67/101  bone]
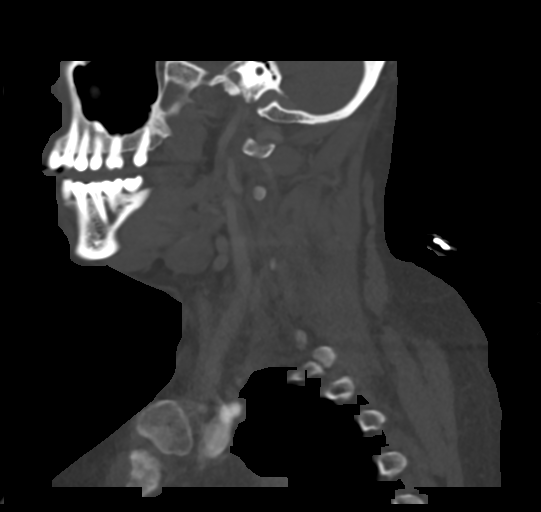

[Series 7: cor neck · coronal · 0.43mm/px · 3 of 128 slices shown]
[im 26/128  bone]
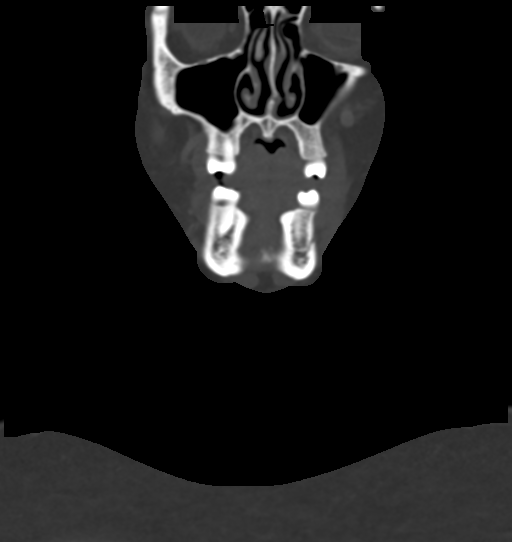
[im 51/128  bone]
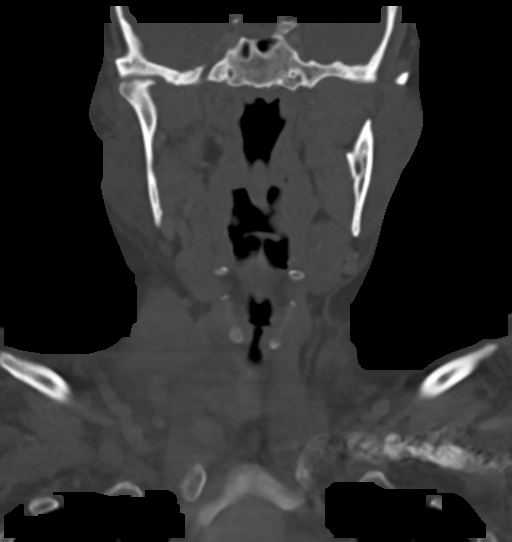
[im 77/128  bone]
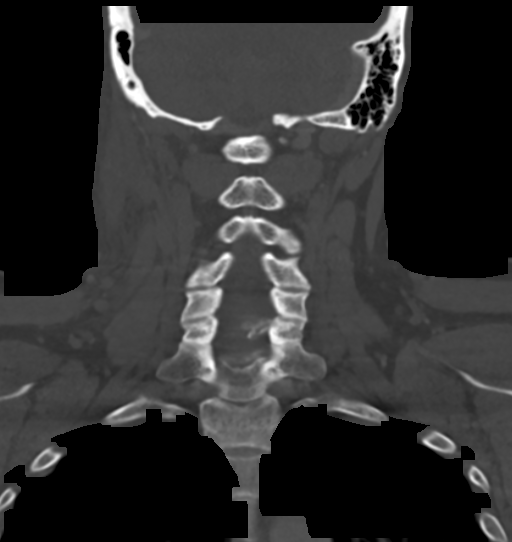

[Series 8: ax oropharynx · axial · 0.39mm/px · z∈[+1068,+1146]mm · 2 of 118 slices shown]
[im 40/118  bone]
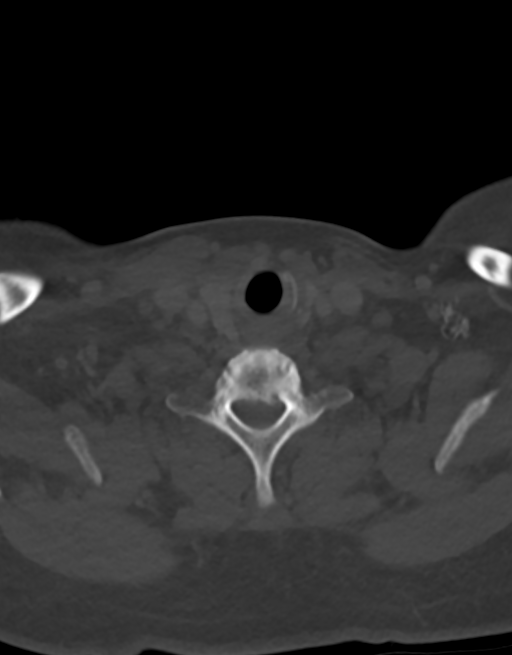
[im 79/118  bone]
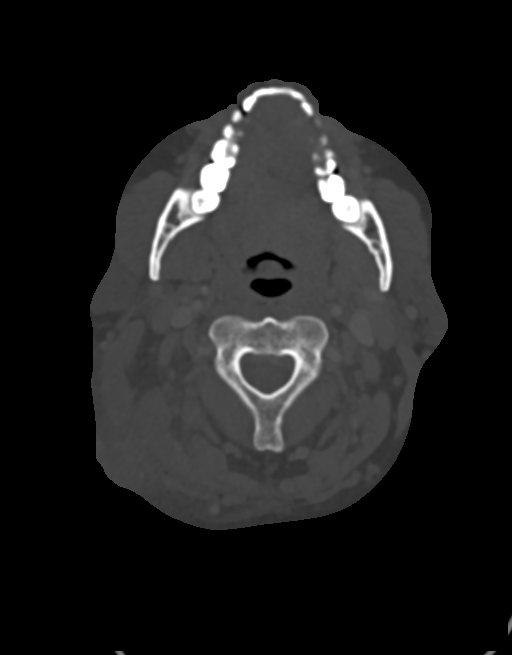

[14 of 33 positions shown; findings below may reference images not displayed]

FINDINGS: PHARYNX AND LARYNX:

--Nasopharynx: Fossae of Sankaran are clear. Normal adenoid
tonsils for age.

--Oral cavity and oropharynx: The palatine and lingual tonsils are
normal. The visible oral cavity and floor of mouth are normal.

--Hypopharynx: Normal vallecula and pyriform sinuses.

--Larynx: Normal epiglottis and pre-epiglottic space. Normal
aryepiglottic and vocal folds.

--Retropharyngeal space: No abscess, effusion or lymphadenopathy.

SALIVARY GLANDS:

--Parotid: No mass lesion or inflammation. No sialolithiasis or
ductal dilatation.

--Submandibular: Symmetric without inflammation. No sialolithiasis
or ductal dilatation.

--Sublingual: Normal. No ranula or other visible lesion of the base
of tongue and floor of mouth.

THYROID: Normal.

LYMPH NODES: Right level 5A nodes measure up to 11 mm. Right level 3
nodes measure up to 10 mm.

VASCULAR: No internal jugular vein thrombosis. Major vessels are
normal.

LIMITED INTRACRANIAL: Normal.

VISUALIZED ORBITS: Normal.

MASTOIDS AND VISUALIZED PARANASAL SINUSES: No fluid levels or
advanced mucosal thickening. No mastoid effusion.

SKELETON: No bony spinal canal stenosis. No lytic or blastic
lesions.

UPPER CHEST: Clear.

OTHER: Cutaneous/subcutaneous phlegmon of the right posterior
triangle with edema of the adjacent posterior neck muscles and right
sternocleidomastoid. No discrete fluid collection. Moderate
surrounding skin thickening.
IMPRESSION: 1. Redemonstration of cutaneous/subcutaneous phlegmon of the right
neck posterior triangle without drainable fluid collection.
2. Edema of the right sternocleidomastoid muscle and right posterior
neck musculature suggestive of myositis.
3. No internal jugular vein thrombosis.
4. Reactive right level 3 and 5A lymphadenopathy.

## 2021-10-07 ENCOUNTER — Emergency Department
Admission: EM | Admit: 2021-10-07 | Discharge: 2021-10-07 | Disposition: A | Discharge status: Home / Self Care | Payer: Self-pay | Attending: Student in an Organized Health Care Education/Training Program | Admitting: Student in an Organized Health Care Education/Training Program

## 2021-10-07 ENCOUNTER — Encounter: Payer: Self-pay | Admitting: Emergency Medicine

## 2021-10-07 ENCOUNTER — Other Ambulatory Visit: Payer: Self-pay

## 2021-10-07 DIAGNOSIS — Z79899 Other long term (current) drug therapy: Secondary | ICD-10-CM | POA: Insufficient documentation

## 2021-10-07 DIAGNOSIS — T401X1A Poisoning by heroin, accidental (unintentional), initial encounter: Secondary | ICD-10-CM | POA: Insufficient documentation

## 2021-10-07 DIAGNOSIS — I10 Essential (primary) hypertension: Secondary | ICD-10-CM | POA: Insufficient documentation

## 2021-10-07 MED ORDER — CLONIDINE HCL 0.1 MG PO TABS
0.1000 mg | ORAL_TABLET | Freq: Two times a day (BID) | ORAL | 0 refills | Status: AC
Start: 2021-10-07 — End: 2021-10-17

## 2021-10-07 MED ORDER — PROMETHAZINE HCL 12.5 MG PO TABS: 12.5000 mg | ORAL_TABLET | Freq: Four times a day (QID) | ORAL | 0 refills | Status: AC | PRN

## 2021-10-07 MED ORDER — NALOXONE HCL 4 MG/0.1ML NA LIQD: NASAL | 0 refills | Status: AC

## 2021-10-07 MED ORDER — ONDANSETRON HCL 4 MG/2ML IJ SOLN
4.0000 mg | Freq: Once | INTRAMUSCULAR | Status: AC
  Administered 2021-10-07: 4 mg via INTRAVENOUS
  Filled 2021-10-07: qty 2

## 2021-10-07 NOTE — ED Notes (Signed)
Pt pulled blue bag containing a powdered substance wrapped in paper out of bra and gave to this RN and Ship broker.  Pt stated that it was heroin and she did not want to take it again. She cried, asked for help with her addiction and spoke with EDP. This RN messaged SW and asked for [employer].  This RN and Ship broker wasted the powdered substance enclosed in paper in the stericycle box.

## 2021-10-07 NOTE — ED Triage Notes (Signed)
Pt arrives [facility], EMS states that pt was visiting [facility] manner and staff found her unresponsive and put her on non rebreather O2.  EMS states RR 4 upon arrival, Narcan    0.5mg  given.  Pt presents to ED A&Ox4. Pt states that she took Heroin x 2 today. SPO2 100% and service animal in backpack.

## 2021-10-07 NOTE — ED Notes (Signed)
Pt given warm blankets and gingerale per her request.  Family member/guardian at bedside.

## 2021-10-07 NOTE — ED Notes (Signed)
This RN called pt contact [facility] to let her know pt is up for D/C.  Contact stated that she would be here in 20-30 minutes.

## 2021-10-07 NOTE — ED Provider Notes (Signed)
[facility] Emergency Department Provider Note    Event Date/Time   First MD Initiated Contact with Patient 10/07/21 1526     (approximate)  I have reviewed the triage vital signs and the nursing notes.   HISTORY  Chief Complaint Drug Overdose (Heroin)    HPI Brianna [facility] is a 50 y.o. female presents to the ER for evaluation of accidental overdose.  States that she does admit to using heroin.  States that she has been using heroin from time to time to control chronic pain for her arthritis.  States she was not trying to harm herself.  States that she does feel little bit jittery right now after she received Narcan but denies any pain no shortness of breath or chest discomfort.  No vomiting.  Is having some mild nausea.  Past Medical History:  Diagnosis Date   Anxiety    Arthritis    Depression    Kidney stones    History reviewed. No pertinent family history. Past Surgical History:  Procedure Laterality Date   I & D EXTREMITY Right 05/11/2019   Procedure: IRRIGATION AND DEBRIDEMENT LEG AND NECK ABSCESS;  Surgeon: Tarry Kos, MD;  Location: [facility] OR;  Service: Orthopedics;  Laterality: Right;   I & D EXTREMITY Right 05/15/2019   Procedure: REPEAT IRRIGATION AND DEBRIDEMENT OF LEG;  Surgeon: Tarry Kos, MD;  Location: [facility] OR;  Service: Orthopedics;  Laterality: Right;   INCISION AND DRAINAGE OF WOUND  05/11/2019   Procedure: Irrigation And Debridement Wound;  Surgeon: Tarry Kos, MD;  Location: [facility] OR;  Service: Orthopedics;;   Patient Active Problem List   Diagnosis Date Noted   Depression 06/09/2019   Cellulitis and abscess of right lower extremity    Normocytic anemia 05/10/2019   Psoriasis 05/10/2019   History of nephrolithiasis 05/10/2019   Obesity 05/10/2019   Neuropathy 05/10/2019   Hypertension 05/10/2019   Soft tissue abscess 05/09/2019      Prior to Admission medications   Medication Sig Start Date End Date Taking? Authorizing Provider   cloNIDine (CATAPRES) 0.1 MG tablet Take 1 tablet (0.1 mg total) by mouth 2 (two) times daily for 10 days. 10/07/21 10/17/21 Yes Willy Eddy, MD  naloxone [facility]) nasal spray 4 mg/0.1 mL Use in the event of suspected overdose of heroin or opiates 10/07/21  Yes Willy Eddy, MD  promethazine (PHENERGAN) 12.5 MG tablet Take 1 tablet (12.5 mg total) by mouth every 6 (six) hours as needed. 10/07/21  Yes Willy Eddy, MD  citalopram (CELEXA) 20 MG tablet Take 1 tablet (20 mg total) by mouth daily. 06/09/19   Laveda Abbe, NP  cyclobenzaprine (FLEXERIL) 5 MG tablet Take 5 mg by mouth 3 (three) times daily as needed for muscle spasms.    [provider]  gabapentin (NEURONTIN) 800 MG tablet Take 1 tablet (800 mg total) by mouth 3 (three) times daily. 06/09/19 07/09/19  Laveda Abbe, NP  methylPREDNISolone (MEDROL DOSEPAK) 4 MG TBPK tablet Use as directed 10/02/19   Arthor Captain, PA-C  metoprolol tartrate (LOPRESSOR) 25 MG tablet Take 1 tablet (25 mg total) by mouth 2 (two) times daily. 06/09/19   Laveda Abbe, NP  predniSONE (DELTASONE) 20 MG tablet Take 2 tablets (40 mg total) by mouth daily. 09/19/19   Eyvonne Mechanic, PA-C    Allergies Doxycycline    Social History Social History   Tobacco Use   Smoking status: Never   Smokeless tobacco: Never  Vaping  Use   Vaping Use: Never used  Substance Use Topics   Alcohol use: No   Drug use: No    Review of Systems Patient denies headaches, rhinorrhea, blurry vision, numbness, shortness of breath, chest pain, edema, cough, abdominal pain, nausea, vomiting, diarrhea, dysuria, fevers, rashes or hallucinations unless otherwise stated above in HPI. ____________________________________________   PHYSICAL EXAM:  VITAL SIGNS: Vitals:   10/07/21 1630 10/07/21 1700  BP: (!) 156/100 (!) 160/99  Pulse: 90 87  Resp: (!) 22 16  Temp:    SpO2: 99% 100%    Constitutional: Alert and oriented.  Eyes:  Conjunctivae are normal.  Head: Atraumatic. Nose: No congestion/rhinnorhea. Mouth/Throat: Mucous membranes are moist.   Neck: No stridor. Painless ROM.  Cardiovascular: borderline tachycardia, regular rhythm. Grossly normal heart sounds.  Good peripheral circulation. Respiratory: Normal respiratory effort.  No retractions. Lungs CTAB. Gastrointestinal: Soft and nontender. No distention. No abdominal bruits. No CVA tenderness. Genitourinary:  Musculoskeletal: No lower extremity tenderness nor edema.  No joint effusions. Neurologic:  Normal speech and language. No gross focal neurologic deficits are appreciated. No facial droop Skin:  Skin is warm, dry and intact. No rash noted. Psychiatric: Mood and affect are normal. Speech and behavior are normal.  ____________________________________________   LABS (all labs ordered are listed, but only abnormal results are displayed)  No results found for this or any previous visit (from the past 24 hour(s)). ____________________________________________  EKG My review and personal interpretation at Time: 15:42   Indication: overdose  Rate: 100  Rhythm: sinus Axis: normal Other: normal intervals, no stemi, no preexcitation syndrome ____________________________________________  RADIOLOGY   ____________________________________________   PROCEDURES  Procedure(s) performed:  .1-3 Lead EKG Interpretation Performed by: Willy Eddy, MD Authorized by: Willy Eddy, MD     Interpretation: normal     ECG rate:  100   ECG rate assessment: normal     Rhythm: sinus rhythm     Ectopy: none     Conduction: normal      Critical Care performed: no ____________________________________________   INITIAL IMPRESSION / ASSESSMENT AND PLAN / ED COURSE  Pertinent labs & imaging results that were available during my care of the patient were reviewed by me and considered in my medical decision making (see chart for details).   DDX:  accidental overdose, intentional overdose, dysrhythmia  Brianna [facility] is a 50 y.o. who presents to the ED with accidental overdose on heroin.  Appropriately responded to Narcan.  Patient does have some mild nausea otherwise well perfused no other complaints.  Do not feel that blood work or further diagnostic testing clinically indicated will observe here in the ER to make sure she does not have rebound symptoms.  Clinical Course as of 10/07/21 1737  Tue Oct 07, 2021  1735 Patient reassessed.  Currently well-appearing no bradypnea no respiratory suppression.  Has been observed for 2hours without any sign of relapse.  She does appear stable and appropriate for outpatient follow-up.  Will be given symptomatic management for possible opioid withdrawal symptoms as well as outpatient resources. [PR]    Clinical Course User Index [PR] Willy Eddy, MD    The patient was evaluated in Emergency Department today for the symptoms described in the history of present illness. He/she was evaluated in the context of the global COVID-19 pandemic, which necessitated consideration that the patient might be at risk for infection with the SARS-CoV-2 virus that causes COVID-19. Institutional protocols and algorithms that pertain to the evaluation of patients at  risk for COVID-19 are in a state of rapid change based on information released by regulatory bodies including the CDC and federal and state organizations. These policies and algorithms were followed during the patient's care in the ED.  As part of my medical decision making, I reviewed the following data within the electronic MEDICAL RECORD NUMBER Nursing notes reviewed and incorporated, Labs reviewed, notes from prior ED visits and [place] Controlled Substance Database   ____________________________________________   FINAL CLINICAL IMPRESSION(S) / ED DIAGNOSES  Final diagnoses:  Accidental overdose of heroin, initial encounter (HCC)      NEW MEDICATIONS  STARTED DURING THIS VISIT:  New Prescriptions   CLONIDINE (CATAPRES) 0.1 MG TABLET    Take 1 tablet (0.1 mg total) by mouth 2 (two) times daily for 10 days.   NALOXONE (NARCAN) NASAL SPRAY 4 MG/0.1 ML    Use in the event of suspected overdose of heroin or opiates   PROMETHAZINE (PHENERGAN) 12.5 MG TABLET    Take 1 tablet (12.5 mg total) by mouth every 6 (six) hours as needed.     Note:  This document was prepared using Dragon voice recognition software and Kulick include unintentional dictation errors.    Willy Eddy, MD 10/07/21 [phone]

## 2021-10-07 NOTE — ED Notes (Addendum)
Pt states that she has warmed up. Mild tremors in hands noticed by this RN. I asked about her blood pressure and she states that she has HTN but has not been taking her medication regularly. Guardian leaving bedside at this time stated that she will be able to take pt with her at D/C.

## 2021-10-07 NOTE — ED Notes (Signed)
Pt C/O nausea.  EDP notified and emesis bag given.

## 2024-09-25 ENCOUNTER — Telehealth: Payer: Self-pay | Admitting: Nurse Practitioner

## 2024-09-25 DIAGNOSIS — F339 Major depressive disorder, recurrent, unspecified: Secondary | ICD-10-CM

## 2024-09-25 DIAGNOSIS — G629 Polyneuropathy, unspecified: Secondary | ICD-10-CM

## 2024-09-25 DIAGNOSIS — G8929 Other chronic pain: Secondary | ICD-10-CM

## 2024-09-25 DIAGNOSIS — K219 Gastro-esophageal reflux disease without esophagitis: Secondary | ICD-10-CM

## 2024-09-25 DIAGNOSIS — I10 Essential (primary) hypertension: Secondary | ICD-10-CM

## 2024-09-25 MED ORDER — HYDROCHLOROTHIAZIDE 25 MG PO TABS: 25.0000 mg | ORAL_TABLET | Freq: Every day | ORAL | 3 refills | Status: AC

## 2024-09-25 MED ORDER — FAMOTIDINE 20 MG PO TABS: 20.0000 mg | ORAL_TABLET | Freq: Every day | ORAL | 0 refills | Status: AC

## 2024-09-25 MED ORDER — AMLODIPINE BESYLATE 10 MG PO TABS: 10.0000 mg | ORAL_TABLET | Freq: Every day | ORAL | 0 refills | Status: AC

## 2024-09-25 MED ORDER — METOPROLOL TARTRATE 100 MG PO TABS
100.0000 mg | ORAL_TABLET | Freq: Two times a day (BID) | ORAL | 0 refills | Status: AC
Start: 2024-09-25 — End: 2024-10-25

## 2024-09-25 MED ORDER — LISINOPRIL 40 MG PO TABS: 40.0000 mg | ORAL_TABLET | Freq: Every day | ORAL | 3 refills | Status: AC

## 2024-09-25 MED ORDER — PREDNISONE 5 MG PO TABS: 5.0000 mg | ORAL_TABLET | Freq: Every day | ORAL | 0 refills | Status: AC

## 2024-09-25 MED ORDER — ACETAMINOPHEN 500 MG PO TABS: 500.0000 mg | ORAL_TABLET | Freq: Four times a day (QID) | ORAL | 0 refills | Status: AC | PRN

## 2024-09-25 MED ORDER — IBUPROFEN 800 MG PO TABS: 800.0000 mg | ORAL_TABLET | Freq: Three times a day (TID) | ORAL | 0 refills | Status: AC | PRN

## 2024-09-25 MED ORDER — GABAPENTIN 800 MG PO TABS
800.0000 mg | ORAL_TABLET | Freq: Four times a day (QID) | ORAL | 0 refills | Status: AC
Start: 2024-09-25 — End: 2024-10-25

## 2024-09-25 MED ORDER — CITALOPRAM HYDROBROMIDE 40 MG PO TABS: 40.0000 mg | ORAL_TABLET | Freq: Every day | ORAL | 0 refills | Status: AC

## 2024-09-25 NOTE — Progress Notes (Signed)
 Acute Video Visit    Virtual Visit Consent:   Brianna [facility], you are scheduled for a virtual visit with a [facility] provider today.     Just as with appointments in the office, your consent must be obtained to participate.  Your consent will be active for this visit and any virtual visit you [name] have with one of our providers in the next 365 days.     If you have a MyChart account, a copy of this consent can be sent to you electronically.  All virtual visits are billed to your insurance company just like a traditional visit in the office.    If the connection with a video visit is poor, the visit Vowels have to be switched to a telephone visit.  With either a video or telephone visit, we are not always able to ensure that we have a secure connection.     I need to obtain your verbal consent now.   Are you willing to proceed with your visit today?    Brianna Randall has provided verbal consent on 09/25/2024 for a virtual visit (video or telephone).   Brianna Kitty, FNP  Date: 09/25/2024 9:39 AM  Subjective:     Patient ID: Brianna Randall, female    DOB: [birth year], 53 y.o.   MRN: [id number]  Brianna Randall, connected with  Brianna Randall  ([id number], 1971-07-22) on 09/25/24 at 11:00 AM EDT by a video-enabled telemedicine application and verified that I am speaking with the correct person using two identifiers.   Location: Patient: Brianna [facility]  Provider: Virtual Visit Location Provider: Home Office   I discussed the limitations of evaluation and management by telemedicine and the availability of in person appointments. The patient expressed understanding and agreed to proceed.     HPI Brianna [facility] is a 53 y.o. who identifies as a female who was assigned female at birth, and is being seen today for request for medication refills.   She is a patient at the [facility] and receives care from Brianna [facility]. She is awaiting a new patient appointment next month. She receives her medical care and SDOH needs at the [facility]  currently/ she is homeless.   History is significant for neuropathy, HTN, depression and GERD.   Denies any acute needs today. She is requesting a 30 day refill until she can get established next month       Objective:      Physical Exam Constitutional:      General: She is not in acute distress.    Appearance: Normal appearance. She is not ill-appearing.  HENT:     Nose: Nose normal.     Mouth/Throat:     Mouth: Mucous membranes are moist.  Pulmonary:     Effort: Pulmonary effort is normal.  Musculoskeletal:     Cervical back: Neck supple.  Neurological:     Mental Status: She is alert and oriented to person, place, and time.  Psychiatric:        Mood and Affect: Mood normal.    ]      Assessment & Plan:    Encouraged follow up with Brianna [facility] while awaiting new appointment next month for any acute needs. Return for BP check at Brianna [facility]    1. Essential hypertension (Primary)  - metoprolol  tartrate (LOPRESSOR ) 100 MG tablet; Take 1 tablet (100 mg total) by mouth 2 (two) times daily.  Dispense: 30 tablet; Refill: 0 - lisinopril (ZESTRIL) 40 MG  tablet; Take 1 tablet (40 mg total) by mouth daily.  Dispense: 90 tablet; Refill: 3 - hydrochlorothiazide (HYDRODIURIL) 25 MG tablet; Take 1 tablet (25 mg total) by mouth daily.  Dispense: 90 tablet; Refill: 3 - amLODipine (NORVASC) 10 MG tablet; Take 1 tablet (10 mg total) by mouth daily.  Dispense: 30 tablet; Refill: 0  2. Neuropathy  - predniSONE  (DELTASONE ) 5 MG tablet; Take 1 tablet (5 mg total) by mouth daily with breakfast.  Dispense: 30 tablet; Refill: 0 - gabapentin  (NEURONTIN ) 800 MG tablet; Take 1 tablet (800 mg total) by mouth in the morning, at noon, in the evening, and at bedtime.  Dispense: 120 tablet; Refill: 0  3. Gastroesophageal reflux disease without esophagitis  - famotidine (PEPCID) 20 MG tablet; Take 1 tablet (20 mg total) by mouth daily.  Dispense: 30 tablet; Refill: 0  4. Other chronic pain  - citalopram   (CELEXA ) 40 MG tablet; Take 1 tablet (40 mg total) by mouth daily.  Dispense: 30 tablet; Refill: 0 - predniSONE  (DELTASONE ) 5 MG tablet; Take 1 tablet (5 mg total) by mouth daily with breakfast.  Dispense: 30 tablet; Refill: 0 - gabapentin  (NEURONTIN ) 800 MG tablet; Take 1 tablet (800 mg total) by mouth in the morning, at noon, in the evening, and at bedtime.  Dispense: 120 tablet; Refill: 0 - acetaminophen  (TYLENOL ) 500 MG tablet; Take 1 tablet (500 mg total) by mouth every 6 (six) hours as needed.  Dispense: 30 tablet; Refill: 0 - ibuprofen (ADVIL) 800 MG tablet; Take 1 tablet (800 mg total) by mouth every 8 (eight) hours as needed.  Dispense: 30 tablet; Refill: 0  5. Depression, recurrent Referred to resources at [facility] . Counseling    Follow Up Instructions: I discussed the assessment and treatment plan with the patient. The patient was provided an opportunity to ask questions and all were answered. The patient agreed with the plan and demonstrated an understanding of the instructions.  A copy of instructions were sent to the patient via MyChart unless otherwise noted below.    The patient was advised to call back or seek an in-person evaluation if the symptoms worsen or if the condition fails to improve as anticipated.    Brianna Kitty, FNP  **Disclaimer: This note Janssens have been dictated with voice recognition software. Similar sounding words can inadvertently be transcribed and this note Credit contain transcription errors which [place] not have been corrected upon publication of note.**

## 2024-12-21 NOTE — Progress Notes (Signed)
 The patient presented for a video visit on 09/25/24 she did not have any screening results due to it being a video visit. During the appt the patient was not screened for SDOH according to what's visible in [facility]. Per chart review NP Ronal Jenkins Houseman is the pcp and there are no past or future visits visible in [facility]l.  No additional Health equity team support indicated at this time.
